# Patient Record
Sex: Female | Born: 1950 | Race: White | Hispanic: No | Smoking: Former smoker
Health system: Southern US, Community
[De-identification: ages and names within clinical notes are randomized; demographics above are authoritative.]

## PROBLEM LIST (undated history)

## (undated) DIAGNOSIS — E119 Type 2 diabetes mellitus without complications: Secondary | ICD-10-CM

## (undated) DIAGNOSIS — F329 Major depressive disorder, single episode, unspecified: Secondary | ICD-10-CM

## (undated) DIAGNOSIS — G4730 Sleep apnea, unspecified: Secondary | ICD-10-CM

## (undated) DIAGNOSIS — J449 Chronic obstructive pulmonary disease, unspecified: Secondary | ICD-10-CM

## (undated) DIAGNOSIS — N189 Chronic kidney disease, unspecified: Secondary | ICD-10-CM

## (undated) DIAGNOSIS — T148XXA Other injury of unspecified body region, initial encounter: Secondary | ICD-10-CM

## (undated) DIAGNOSIS — I10 Essential (primary) hypertension: Secondary | ICD-10-CM

## (undated) DIAGNOSIS — I4892 Unspecified atrial flutter: Secondary | ICD-10-CM

## (undated) DIAGNOSIS — C189 Malignant neoplasm of colon, unspecified: Secondary | ICD-10-CM

## (undated) DIAGNOSIS — M1990 Unspecified osteoarthritis, unspecified site: Secondary | ICD-10-CM

## (undated) DIAGNOSIS — R062 Wheezing: Secondary | ICD-10-CM

## (undated) DIAGNOSIS — E669 Obesity, unspecified: Secondary | ICD-10-CM

## (undated) DIAGNOSIS — F419 Anxiety disorder, unspecified: Secondary | ICD-10-CM

## (undated) DIAGNOSIS — K219 Gastro-esophageal reflux disease without esophagitis: Secondary | ICD-10-CM

## (undated) DIAGNOSIS — E785 Hyperlipidemia, unspecified: Secondary | ICD-10-CM

## (undated) DIAGNOSIS — F32A Depression, unspecified: Secondary | ICD-10-CM

## (undated) HISTORY — PX: COLON SURGERY: SHX602

## (undated) HISTORY — DX: Obesity, unspecified: E66.9

## (undated) HISTORY — PX: CHOLECYSTECTOMY: SHX55

## (undated) HISTORY — PX: APPENDECTOMY: SHX54

## (undated) HISTORY — DX: Malignant neoplasm of colon, unspecified: C18.9

## (undated) HISTORY — DX: Essential (primary) hypertension: I10

## (undated) HISTORY — DX: Gastro-esophageal reflux disease without esophagitis: K21.9

## (undated) HISTORY — DX: Type 2 diabetes mellitus without complications: E11.9

## (undated) HISTORY — DX: Unspecified atrial flutter: I48.92

---

## 2012-05-05 ENCOUNTER — Ambulatory Visit: Payer: Self-pay

## 2013-09-17 ENCOUNTER — Ambulatory Visit: Payer: Self-pay | Admitting: Family Medicine

## 2014-04-30 ENCOUNTER — Observation Stay: Payer: Self-pay | Admitting: Surgery

## 2014-04-30 LAB — CBC WITH DIFFERENTIAL/PLATELET
Basophil #: 0.1 10*3/uL (ref 0.0–0.1)
Basophil %: 0.7 %
Eosinophil #: 0.2 10*3/uL (ref 0.0–0.7)
Eosinophil %: 1.1 %
HCT: 37.3 % (ref 35.0–47.0)
HGB: 11.9 g/dL — ABNORMAL LOW (ref 12.0–16.0)
Lymphocyte #: 2.2 10*3/uL (ref 1.0–3.6)
Lymphocyte %: 10.2 %
MCH: 27.1 pg (ref 26.0–34.0)
MCHC: 32 g/dL (ref 32.0–36.0)
MCV: 85 fL (ref 80–100)
MONOS PCT: 4.7 %
Monocyte #: 1 x10 3/mm — ABNORMAL HIGH (ref 0.2–0.9)
Neutrophil #: 17.7 10*3/uL — ABNORMAL HIGH (ref 1.4–6.5)
Neutrophil %: 83.3 %
Platelet: 311 10*3/uL (ref 150–440)
RBC: 4.4 10*6/uL (ref 3.80–5.20)
RDW: 16.1 % — ABNORMAL HIGH (ref 11.5–14.5)
WBC: 21.3 10*3/uL — ABNORMAL HIGH (ref 3.6–11.0)

## 2014-04-30 LAB — URINALYSIS, COMPLETE
BLOOD: NEGATIVE
Bacteria: NONE SEEN
Bilirubin,UR: NEGATIVE
Glucose,UR: NEGATIVE mg/dL (ref 0–75)
LEUKOCYTE ESTERASE: NEGATIVE
Nitrite: NEGATIVE
Ph: 5 (ref 4.5–8.0)
Protein: NEGATIVE
RBC,UR: 1 /HPF (ref 0–5)
Specific Gravity: 1.02 (ref 1.003–1.030)
Squamous Epithelial: 2

## 2014-04-30 LAB — LIPASE, BLOOD: Lipase: 109 U/L (ref 73–393)

## 2014-04-30 LAB — COMPREHENSIVE METABOLIC PANEL
Albumin: 3.5 g/dL (ref 3.4–5.0)
Alkaline Phosphatase: 117 U/L — ABNORMAL HIGH
Anion Gap: 10 (ref 7–16)
BILIRUBIN TOTAL: 0.4 mg/dL (ref 0.2–1.0)
BUN: 15 mg/dL (ref 7–18)
CO2: 26 mmol/L (ref 21–32)
Calcium, Total: 9 mg/dL (ref 8.5–10.1)
Chloride: 97 mmol/L — ABNORMAL LOW (ref 98–107)
Creatinine: 0.81 mg/dL (ref 0.60–1.30)
EGFR (Non-African Amer.): >60
Glucose: 180 mg/dL — ABNORMAL HIGH (ref 65–99)
Osmolality: 272 (ref 275–301)
Potassium: 3.7 mmol/L (ref 3.5–5.1)
SGOT(AST): 18 U/L (ref 15–37)
SGPT (ALT): 32 U/L
SODIUM: 133 mmol/L — AB (ref 136–145)
Total Protein: 8.1 g/dL (ref 6.4–8.2)

## 2014-04-30 LAB — TROPONIN I

## 2014-05-02 LAB — CBC WITH DIFFERENTIAL/PLATELET
Basophil #: 0.1 10*3/uL (ref 0.0–0.1)
Basophil %: 0.5 %
EOS PCT: 0.4 %
Eosinophil #: 0.1 10*3/uL (ref 0.0–0.7)
HCT: 29.7 % — ABNORMAL LOW (ref 35.0–47.0)
HGB: 9.4 g/dL — ABNORMAL LOW (ref 12.0–16.0)
LYMPHS ABS: 2.5 10*3/uL (ref 1.0–3.6)
LYMPHS PCT: 13.6 %
MCH: 27.4 pg (ref 26.0–34.0)
MCHC: 31.8 g/dL — ABNORMAL LOW (ref 32.0–36.0)
MCV: 86 fL (ref 80–100)
MONO ABS: 1.5 x10 3/mm — AB (ref 0.2–0.9)
Monocyte %: 8.1 %
NEUTROS PCT: 77.4 %
Neutrophil #: 14.1 10*3/uL — ABNORMAL HIGH (ref 1.4–6.5)
PLATELETS: 261 10*3/uL (ref 150–440)
RBC: 3.45 10*6/uL — ABNORMAL LOW (ref 3.80–5.20)
RDW: 16.4 % — AB (ref 11.5–14.5)
WBC: 18.3 10*3/uL — ABNORMAL HIGH (ref 3.6–11.0)

## 2014-05-02 LAB — BASIC METABOLIC PANEL
Anion Gap: 5 — ABNORMAL LOW (ref 7–16)
BUN: 13 mg/dL (ref 7–18)
Calcium, Total: 7.8 mg/dL — ABNORMAL LOW (ref 8.5–10.1)
Chloride: 100 mmol/L (ref 98–107)
Co2: 27 mmol/L (ref 21–32)
Creatinine: 0.96 mg/dL (ref 0.60–1.30)
EGFR (African American): >60
EGFR (Non-African Amer.): >60
GLUCOSE: 150 mg/dL — AB (ref 65–99)
OSMOLALITY: 267 (ref 275–301)
Potassium: 3.6 mmol/L (ref 3.5–5.1)
Sodium: 132 mmol/L — ABNORMAL LOW (ref 136–145)

## 2014-07-09 ENCOUNTER — Ambulatory Visit
Admit: 2014-07-09 | Disposition: A | Discharge status: Home / Self Care | Payer: Self-pay | Attending: Internal Medicine | Admitting: Internal Medicine

## 2014-07-10 ENCOUNTER — Inpatient Hospital Stay: Payer: Medicare Other | Admitting: Surgery

## 2014-07-12 DIAGNOSIS — I4892 Unspecified atrial flutter: Secondary | ICD-10-CM

## 2014-07-12 DIAGNOSIS — I4891 Unspecified atrial fibrillation: Secondary | ICD-10-CM | POA: Diagnosis not present

## 2014-07-13 DIAGNOSIS — I4892 Unspecified atrial flutter: Secondary | ICD-10-CM | POA: Diagnosis not present

## 2014-07-14 DIAGNOSIS — I4892 Unspecified atrial flutter: Secondary | ICD-10-CM | POA: Diagnosis not present

## 2014-07-15 ENCOUNTER — Encounter: Payer: Self-pay | Admitting: Physician Assistant

## 2014-07-15 ENCOUNTER — Other Ambulatory Visit: Payer: Self-pay | Admitting: Physician Assistant

## 2014-07-15 DIAGNOSIS — E119 Type 2 diabetes mellitus without complications: Secondary | ICD-10-CM | POA: Insufficient documentation

## 2014-07-15 DIAGNOSIS — I4891 Unspecified atrial fibrillation: Secondary | ICD-10-CM | POA: Diagnosis not present

## 2014-07-15 DIAGNOSIS — K219 Gastro-esophageal reflux disease without esophagitis: Secondary | ICD-10-CM | POA: Insufficient documentation

## 2014-07-15 DIAGNOSIS — I10 Essential (primary) hypertension: Secondary | ICD-10-CM | POA: Insufficient documentation

## 2014-07-15 DIAGNOSIS — E669 Obesity, unspecified: Secondary | ICD-10-CM | POA: Insufficient documentation

## 2014-07-15 DIAGNOSIS — I4892 Unspecified atrial flutter: Secondary | ICD-10-CM | POA: Insufficient documentation

## 2014-07-17 DIAGNOSIS — I4892 Unspecified atrial flutter: Secondary | ICD-10-CM | POA: Diagnosis not present

## 2014-07-20 DIAGNOSIS — I4891 Unspecified atrial fibrillation: Secondary | ICD-10-CM | POA: Diagnosis not present

## 2014-07-22 DIAGNOSIS — I4891 Unspecified atrial fibrillation: Secondary | ICD-10-CM | POA: Diagnosis not present

## 2014-07-23 DIAGNOSIS — I480 Paroxysmal atrial fibrillation: Secondary | ICD-10-CM | POA: Diagnosis not present

## 2014-07-24 DIAGNOSIS — I4891 Unspecified atrial fibrillation: Secondary | ICD-10-CM | POA: Diagnosis not present

## 2014-07-25 DIAGNOSIS — I480 Paroxysmal atrial fibrillation: Secondary | ICD-10-CM | POA: Diagnosis not present

## 2014-07-26 DIAGNOSIS — I480 Paroxysmal atrial fibrillation: Secondary | ICD-10-CM

## 2014-07-27 ENCOUNTER — Ambulatory Visit: Admit: 2014-07-27 | Disposition: A | Discharge status: Home / Self Care | Payer: Self-pay | Admitting: Surgery

## 2014-07-27 DIAGNOSIS — I480 Paroxysmal atrial fibrillation: Secondary | ICD-10-CM | POA: Diagnosis not present

## 2014-07-28 DIAGNOSIS — I480 Paroxysmal atrial fibrillation: Secondary | ICD-10-CM | POA: Diagnosis not present

## 2014-07-29 DIAGNOSIS — I480 Paroxysmal atrial fibrillation: Secondary | ICD-10-CM | POA: Diagnosis not present

## 2014-07-30 DIAGNOSIS — I480 Paroxysmal atrial fibrillation: Secondary | ICD-10-CM | POA: Diagnosis not present

## 2014-07-31 DIAGNOSIS — I480 Paroxysmal atrial fibrillation: Secondary | ICD-10-CM | POA: Diagnosis not present

## 2014-08-01 DIAGNOSIS — I480 Paroxysmal atrial fibrillation: Secondary | ICD-10-CM | POA: Diagnosis not present

## 2014-08-01 LAB — CBC WITH DIFFERENTIAL/PLATELET
BANDS NEUTROPHIL: 1 %
EOS PCT: 1 %
HCT: 24.7 % — ABNORMAL LOW (ref 35.0–47.0)
HGB: 7.7 g/dL — AB (ref 12.0–16.0)
LYMPHS PCT: 8 %
MCH: 24.7 pg — ABNORMAL LOW (ref 26.0–34.0)
MCHC: 31.2 g/dL — AB (ref 32.0–36.0)
MCV: 79 fL — ABNORMAL LOW (ref 80–100)
Metamyelocyte: 3 %
Monocytes: 4 %
Myelocyte: 1 %
Platelet: 198 10*3/uL (ref 150–440)
RBC: 3.11 10*6/uL — ABNORMAL LOW (ref 3.80–5.20)
RDW: 22.7 % — ABNORMAL HIGH (ref 11.5–14.5)
SEGMENTED NEUTROPHILS: 82 %
WBC: 18.8 10*3/uL — AB (ref 3.6–11.0)

## 2014-08-01 LAB — BASIC METABOLIC PANEL
Anion Gap: 12 (ref 7–16)
BUN: 34 mg/dL — AB
CO2: 22 mmol/L
CREATININE: 4.57 mg/dL — AB
Calcium, Total: 8 mg/dL — ABNORMAL LOW
Chloride: 104 mmol/L
EGFR (African American): 11 — ABNORMAL LOW
GFR CALC NON AF AMER: 10 — AB
GLUCOSE: 91 mg/dL
Potassium: 4.5 mmol/L
Sodium: 138 mmol/L

## 2014-08-01 LAB — PROTIME-INR
INR: 1.3
Prothrombin Time: 16.4 secs — ABNORMAL HIGH

## 2014-08-01 LAB — APTT: ACTIVATED PTT: 42.4 s — AB (ref 23.6–35.9)

## 2014-08-02 DIAGNOSIS — I480 Paroxysmal atrial fibrillation: Secondary | ICD-10-CM | POA: Diagnosis not present

## 2014-08-02 LAB — BASIC METABOLIC PANEL
ANION GAP: 12 (ref 7–16)
BUN: 47 mg/dL — ABNORMAL HIGH
CO2: 20 mmol/L — AB
Calcium, Total: 8.3 mg/dL — ABNORMAL LOW
Chloride: 105 mmol/L
Creatinine: 5.83 mg/dL — ABNORMAL HIGH
GFR CALC AF AMER: 8 — AB
GFR CALC NON AF AMER: 7 — AB
Glucose: 83 mg/dL
Potassium: 4 mmol/L
Sodium: 137 mmol/L

## 2014-08-02 LAB — CBC WITH DIFFERENTIAL/PLATELET
Basophil #: 0.2 10*3/uL — ABNORMAL HIGH (ref 0.0–0.1)
Basophil %: 1 %
Eosinophil #: 0.3 10*3/uL (ref 0.0–0.7)
Eosinophil %: 1.7 %
HCT: 27.7 % — ABNORMAL LOW (ref 35.0–47.0)
HGB: 8.5 g/dL — ABNORMAL LOW (ref 12.0–16.0)
Lymphocyte #: 2.1 10*3/uL (ref 1.0–3.6)
Lymphocyte %: 12.2 %
MCH: 24.8 pg — AB (ref 26.0–34.0)
MCHC: 30.9 g/dL — ABNORMAL LOW (ref 32.0–36.0)
MCV: 80 fL (ref 80–100)
MONO ABS: 1.6 x10 3/mm — AB (ref 0.2–0.9)
MONOS PCT: 9.4 %
Neutrophil #: 12.8 10*3/uL — ABNORMAL HIGH (ref 1.4–6.5)
Neutrophil %: 75.7 %
PLATELETS: 208 10*3/uL (ref 150–440)
RBC: 3.44 10*6/uL — AB (ref 3.80–5.20)
RDW: 23.2 % — ABNORMAL HIGH (ref 11.5–14.5)
WBC: 16.9 10*3/uL — ABNORMAL HIGH (ref 3.6–11.0)

## 2014-08-02 LAB — PHOSPHORUS: PHOSPHORUS: 6.4 mg/dL — AB

## 2014-08-02 LAB — HEPARIN LEVEL (UNFRACTIONATED)
Anti-Xa(Unfractionated): 0.36 IU/mL (ref 0.30–0.70)
Anti-Xa(Unfractionated): <0.1 IU/mL — ABNORMAL LOW (ref 0.30–0.70)

## 2014-08-02 LAB — PROTIME-INR
INR: 1.3
PROTHROMBIN TIME: 15.9 s — AB

## 2014-08-02 LAB — VANCOMYCIN, TROUGH: VANCOMYCIN, TROUGH: 20 ug/mL

## 2014-08-03 LAB — CBC WITH DIFFERENTIAL/PLATELET
BASOS PCT: 0.7 %
Basophil #: 0.1 10*3/uL (ref 0.0–0.1)
Eosinophil #: 0.2 10*3/uL (ref 0.0–0.7)
Eosinophil %: 1.3 %
HCT: 27.4 % — ABNORMAL LOW (ref 35.0–47.0)
HGB: 8.3 g/dL — ABNORMAL LOW (ref 12.0–16.0)
LYMPHS ABS: 1.8 10*3/uL (ref 1.0–3.6)
Lymphocyte %: 11.1 %
MCH: 24.6 pg — AB (ref 26.0–34.0)
MCHC: 30.4 g/dL — AB (ref 32.0–36.0)
MCV: 81 fL (ref 80–100)
MONO ABS: 1.5 x10 3/mm — AB (ref 0.2–0.9)
Monocyte %: 9.5 %
Neutrophil #: 12.3 10*3/uL — ABNORMAL HIGH (ref 1.4–6.5)
Neutrophil %: 77.4 %
PLATELETS: 178 10*3/uL (ref 150–440)
RBC: 3.39 10*6/uL — ABNORMAL LOW (ref 3.80–5.20)
RDW: 24.2 % — ABNORMAL HIGH (ref 11.5–14.5)
WBC: 15.9 10*3/uL — ABNORMAL HIGH (ref 3.6–11.0)

## 2014-08-03 LAB — HEPARIN LEVEL (UNFRACTIONATED)
ANTI-XA(UNFRACTIONATED): 0.16 [IU]/mL — AB (ref 0.30–0.70)
Anti-Xa(Unfractionated): 0.25 IU/mL — ABNORMAL LOW (ref 0.30–0.70)

## 2014-08-04 LAB — CBC WITH DIFFERENTIAL/PLATELET
Basophil #: 0.1 10*3/uL (ref 0.0–0.1)
Basophil %: 1 %
Eosinophil #: 0.3 10*3/uL (ref 0.0–0.7)
Eosinophil %: 2.2 %
HCT: 27.3 % — ABNORMAL LOW (ref 35.0–47.0)
HGB: 8.4 g/dL — ABNORMAL LOW (ref 12.0–16.0)
LYMPHS ABS: 1.7 10*3/uL (ref 1.0–3.6)
Lymphocyte %: 11.8 %
MCH: 24.7 pg — ABNORMAL LOW (ref 26.0–34.0)
MCHC: 30.8 g/dL — ABNORMAL LOW (ref 32.0–36.0)
MCV: 80 fL (ref 80–100)
MONOS PCT: 10.3 %
Monocyte #: 1.5 x10 3/mm — ABNORMAL HIGH (ref 0.2–0.9)
Neutrophil #: 11.1 10*3/uL — ABNORMAL HIGH (ref 1.4–6.5)
Neutrophil %: 74.7 %
Platelet: 191 10*3/uL (ref 150–440)
RBC: 3.4 10*6/uL — AB (ref 3.80–5.20)
RDW: 24.6 % — AB (ref 11.5–14.5)
WBC: 14.9 10*3/uL — ABNORMAL HIGH (ref 3.6–11.0)

## 2014-08-04 LAB — HEPARIN LEVEL (UNFRACTIONATED)
ANTI-XA(UNFRACTIONATED): 0.44 [IU]/mL (ref 0.30–0.70)
ANTI-XA(UNFRACTIONATED): 0.49 [IU]/mL (ref 0.30–0.70)

## 2014-08-05 LAB — CBC WITH DIFFERENTIAL/PLATELET
Basophil #: 0.1 10*3/uL (ref 0.0–0.1)
Basophil %: 1.1 %
Eosinophil #: 0.4 10*3/uL (ref 0.0–0.7)
Eosinophil %: 3.1 %
HCT: 28.5 % — AB (ref 35.0–47.0)
HGB: 8.6 g/dL — ABNORMAL LOW (ref 12.0–16.0)
LYMPHS ABS: 1.7 10*3/uL (ref 1.0–3.6)
Lymphocyte %: 12.8 %
MCH: 24.7 pg — AB (ref 26.0–34.0)
MCHC: 30.2 g/dL — AB (ref 32.0–36.0)
MCV: 82 fL (ref 80–100)
MONOS PCT: 10.9 %
Monocyte #: 1.5 x10 3/mm — ABNORMAL HIGH (ref 0.2–0.9)
NEUTROS ABS: 9.8 10*3/uL — AB (ref 1.4–6.5)
Neutrophil %: 72.1 %
Platelet: 206 10*3/uL (ref 150–440)
RBC: 3.48 10*6/uL — ABNORMAL LOW (ref 3.80–5.20)
RDW: 25.5 % — AB (ref 11.5–14.5)
WBC: 13.5 10*3/uL — ABNORMAL HIGH (ref 3.6–11.0)

## 2014-08-05 LAB — RENAL FUNCTION PANEL
ALBUMIN: 2.2 g/dL — AB
ANION GAP: 13 (ref 7–16)
BUN: 46 mg/dL — ABNORMAL HIGH
CALCIUM: 8.4 mg/dL — AB
CREATININE: 6.42 mg/dL — AB
Chloride: 106 mmol/L
Co2: 20 mmol/L — ABNORMAL LOW
EGFR (Non-African Amer.): 6 — ABNORMAL LOW
GFR CALC AF AMER: 7 — AB
Glucose: 81 mg/dL
POTASSIUM: 4.4 mmol/L
Phosphorus: 6.9 mg/dL — ABNORMAL HIGH
SODIUM: 139 mmol/L

## 2014-08-05 LAB — PROTIME-INR
INR: 1.5
Prothrombin Time: 18.3 secs — ABNORMAL HIGH

## 2014-08-05 LAB — HEPARIN LEVEL (UNFRACTIONATED): ANTI-XA(UNFRACTIONATED): 0.5 [IU]/mL (ref 0.30–0.70)

## 2014-08-06 ENCOUNTER — Other Ambulatory Visit: Payer: Self-pay | Admitting: Physician Assistant

## 2014-08-06 ENCOUNTER — Inpatient Hospital Stay
Admission: AD | Admit: 2014-08-06 | Discharge: 2014-10-04 | Disposition: A | Discharge status: Skilled Nursing Facility | Payer: Self-pay | Source: Ambulatory Visit | Attending: Internal Medicine | Admitting: Internal Medicine

## 2014-08-06 DIAGNOSIS — Z9889 Other specified postprocedural states: Secondary | ICD-10-CM

## 2014-08-06 DIAGNOSIS — R0603 Acute respiratory distress: Secondary | ICD-10-CM

## 2014-08-06 DIAGNOSIS — I4892 Unspecified atrial flutter: Secondary | ICD-10-CM

## 2014-08-06 DIAGNOSIS — IMO0002 Reserved for concepts with insufficient information to code with codable children: Secondary | ICD-10-CM | POA: Insufficient documentation

## 2014-08-06 DIAGNOSIS — L0291 Cutaneous abscess, unspecified: Secondary | ICD-10-CM

## 2014-08-06 DIAGNOSIS — R0689 Other abnormalities of breathing: Secondary | ICD-10-CM

## 2014-08-06 DIAGNOSIS — R1084 Generalized abdominal pain: Secondary | ICD-10-CM

## 2014-08-06 DIAGNOSIS — R9409 Abnormal results of other function studies of central nervous system: Secondary | ICD-10-CM

## 2014-08-06 DIAGNOSIS — R0602 Shortness of breath: Secondary | ICD-10-CM

## 2014-08-06 DIAGNOSIS — Z452 Encounter for adjustment and management of vascular access device: Secondary | ICD-10-CM

## 2014-08-06 DIAGNOSIS — J9690 Respiratory failure, unspecified, unspecified whether with hypoxia or hypercapnia: Secondary | ICD-10-CM

## 2014-08-06 DIAGNOSIS — J90 Pleural effusion, not elsewhere classified: Secondary | ICD-10-CM

## 2014-08-06 LAB — CBC WITH DIFFERENTIAL/PLATELET
Basophil #: 0.1 10*3/uL (ref 0.0–0.1)
Basophil %: 0.8 %
EOS PCT: 2.7 %
Eosinophil #: 0.4 10*3/uL (ref 0.0–0.7)
HCT: 28.3 % — ABNORMAL LOW (ref 35.0–47.0)
HGB: 8.9 g/dL — ABNORMAL LOW (ref 12.0–16.0)
LYMPHS PCT: 9.4 %
Lymphocyte #: 1.4 10*3/uL (ref 1.0–3.6)
MCH: 25.2 pg — ABNORMAL LOW (ref 26.0–34.0)
MCHC: 31.4 g/dL — AB (ref 32.0–36.0)
MCV: 80 fL (ref 80–100)
Monocyte #: 1.5 x10 3/mm — ABNORMAL HIGH (ref 0.2–0.9)
Monocyte %: 10.6 %
NEUTROS ABS: 11.1 10*3/uL — AB (ref 1.4–6.5)
NEUTROS PCT: 76.5 %
Platelet: 195 10*3/uL (ref 150–440)
RBC: 3.53 10*6/uL — AB (ref 3.80–5.20)
RDW: 25.8 % — ABNORMAL HIGH (ref 11.5–14.5)
WBC: 14.5 10*3/uL — AB (ref 3.6–11.0)

## 2014-08-06 LAB — PROTIME-INR
INR: 1.8
Prothrombin Time: 20.7 secs — ABNORMAL HIGH

## 2014-08-06 LAB — HEPARIN LEVEL (UNFRACTIONATED): Anti-Xa(Unfractionated): <0.1 IU/mL — ABNORMAL LOW (ref 0.30–0.70)

## 2014-08-07 ENCOUNTER — Ambulatory Visit (HOSPITAL_COMMUNITY)
Admission: AD | Admit: 2014-08-07 | Discharge: 2014-08-07 | Disposition: A | Discharge status: Home / Self Care | Payer: Medicare Other | Source: Other Acute Inpatient Hospital | Attending: Internal Medicine | Admitting: Internal Medicine

## 2014-08-07 DIAGNOSIS — K5720 Diverticulitis of large intestine with perforation and abscess without bleeding: Secondary | ICD-10-CM | POA: Diagnosis present

## 2014-08-07 DIAGNOSIS — A419 Sepsis, unspecified organism: Secondary | ICD-10-CM | POA: Diagnosis not present

## 2014-08-07 LAB — CBC
HCT: 28.7 % — ABNORMAL LOW (ref 36.0–46.0)
Hemoglobin: 8.7 g/dL — ABNORMAL LOW (ref 12.0–15.0)
MCH: 25.3 pg — ABNORMAL LOW (ref 26.0–34.0)
MCHC: 30.3 g/dL (ref 30.0–36.0)
MCV: 83.4 fL (ref 78.0–100.0)
Platelets: 215 10*3/uL (ref 150–400)
RBC: 3.44 MIL/uL — ABNORMAL LOW (ref 3.87–5.11)
RDW: 25.2 % — ABNORMAL HIGH (ref 11.5–15.5)
WBC: 12.4 10*3/uL — ABNORMAL HIGH (ref 4.0–10.5)

## 2014-08-07 LAB — COMPREHENSIVE METABOLIC PANEL
ALK PHOS: 142 U/L — AB (ref 39–117)
ALT: 19 U/L (ref 0–35)
AST: 19 U/L (ref 0–37)
Albumin: 1.9 g/dL — ABNORMAL LOW (ref 3.5–5.2)
Anion gap: 12 (ref 5–15)
BILIRUBIN TOTAL: 0.5 mg/dL (ref 0.3–1.2)
BUN: 27 mg/dL — AB (ref 6–23)
CHLORIDE: 104 mmol/L (ref 96–112)
CO2: 24 mmol/L (ref 19–32)
Calcium: 8.7 mg/dL (ref 8.4–10.5)
Creatinine, Ser: 5.84 mg/dL — ABNORMAL HIGH (ref 0.50–1.10)
GFR calc Af Amer: 8 mL/min — ABNORMAL LOW (ref >90)
GFR calc non Af Amer: 7 mL/min — ABNORMAL LOW (ref >90)
Glucose, Bld: 86 mg/dL (ref 70–99)
POTASSIUM: 4.2 mmol/L (ref 3.5–5.1)
Sodium: 140 mmol/L (ref 135–145)
Total Protein: 5.6 g/dL — ABNORMAL LOW (ref 6.0–8.3)

## 2014-08-07 LAB — MAGNESIUM: Magnesium: 1.9 mg/dL (ref 1.5–2.5)

## 2014-08-07 LAB — PHOSPHORUS: Phosphorus: 6.1 mg/dL — ABNORMAL HIGH (ref 2.3–4.6)

## 2014-08-07 LAB — PROTIME-INR
INR: 2.46 — AB (ref 0.00–1.49)
Prothrombin Time: 26.9 seconds — ABNORMAL HIGH (ref 11.6–15.2)

## 2014-08-07 LAB — VANCOMYCIN, RANDOM: Vancomycin Rm: 18.9 ug/mL

## 2014-08-08 LAB — PROTIME-INR
INR: 2.14 — AB (ref 0.00–1.49)
Prothrombin Time: 24.1 seconds — ABNORMAL HIGH (ref 11.6–15.2)

## 2014-08-09 ENCOUNTER — Ambulatory Visit
Admit: 2014-08-09 | Disposition: A | Discharge status: Home / Self Care | Payer: Self-pay | Attending: Internal Medicine | Admitting: Internal Medicine

## 2014-08-09 LAB — PROTIME-INR
INR: 2.52 — ABNORMAL HIGH (ref 0.00–1.49)
Prothrombin Time: 27.4 seconds — ABNORMAL HIGH (ref 11.6–15.2)

## 2014-08-10 LAB — COMPREHENSIVE METABOLIC PANEL
ALBUMIN: 1.7 g/dL — AB (ref 3.5–5.2)
ALT: 18 U/L (ref 0–35)
ANION GAP: 14 (ref 5–15)
AST: 17 U/L (ref 0–37)
Alkaline Phosphatase: 139 U/L — ABNORMAL HIGH (ref 39–117)
BILIRUBIN TOTAL: 0.4 mg/dL (ref 0.3–1.2)
BUN: 48 mg/dL — ABNORMAL HIGH (ref 6–23)
CO2: 18 mmol/L — ABNORMAL LOW (ref 19–32)
CREATININE: 8.1 mg/dL — AB (ref 0.50–1.10)
Calcium: 8.2 mg/dL — ABNORMAL LOW (ref 8.4–10.5)
Chloride: 106 mmol/L (ref 96–112)
GFR calc Af Amer: 6 mL/min — ABNORMAL LOW (ref >90)
GFR calc non Af Amer: 5 mL/min — ABNORMAL LOW (ref >90)
Glucose, Bld: 90 mg/dL (ref 70–99)
POTASSIUM: 4.3 mmol/L (ref 3.5–5.1)
SODIUM: 138 mmol/L (ref 135–145)
TOTAL PROTEIN: 5.5 g/dL — AB (ref 6.0–8.3)

## 2014-08-10 LAB — CBC WITH DIFFERENTIAL/PLATELET
BASOS PCT: 0 % (ref 0–1)
Basophils Absolute: 0 10*3/uL (ref 0.0–0.1)
Eosinophils Absolute: 0.7 10*3/uL (ref 0.0–0.7)
Eosinophils Relative: 5 % (ref 0–5)
HEMATOCRIT: 28.3 % — AB (ref 36.0–46.0)
HEMOGLOBIN: 8.7 g/dL — AB (ref 12.0–15.0)
LYMPHS PCT: 9 % — AB (ref 12–46)
Lymphs Abs: 1.2 10*3/uL (ref 0.7–4.0)
MCH: 25.7 pg — ABNORMAL LOW (ref 26.0–34.0)
MCHC: 30.7 g/dL (ref 30.0–36.0)
MCV: 83.5 fL (ref 78.0–100.0)
Monocytes Absolute: 1.4 10*3/uL — ABNORMAL HIGH (ref 0.1–1.0)
Monocytes Relative: 10 % (ref 3–12)
NEUTROS PCT: 76 % (ref 43–77)
Neutro Abs: 10.5 10*3/uL — ABNORMAL HIGH (ref 1.7–7.7)
Platelets: 236 10*3/uL (ref 150–400)
RBC: 3.39 MIL/uL — ABNORMAL LOW (ref 3.87–5.11)
RDW: 25.1 % — AB (ref 11.5–15.5)
WBC: 13.8 10*3/uL — ABNORMAL HIGH (ref 4.0–10.5)

## 2014-08-10 LAB — PROTIME-INR
INR: 2.59 — AB (ref 0.00–1.49)
Prothrombin Time: 28 seconds — ABNORMAL HIGH (ref 11.6–15.2)

## 2014-08-11 LAB — BASIC METABOLIC PANEL
Anion gap: 13 (ref 5–15)
BUN: 54 mg/dL — AB (ref 6–23)
CALCIUM: 8.3 mg/dL — AB (ref 8.4–10.5)
CHLORIDE: 101 mmol/L (ref 96–112)
CO2: 22 mmol/L (ref 19–32)
CREATININE: 8.73 mg/dL — AB (ref 0.50–1.10)
GFR calc Af Amer: 5 mL/min — ABNORMAL LOW (ref >90)
GFR calc non Af Amer: 4 mL/min — ABNORMAL LOW (ref >90)
Glucose, Bld: 98 mg/dL (ref 70–99)
Potassium: 4.2 mmol/L (ref 3.5–5.1)
Sodium: 136 mmol/L (ref 135–145)

## 2014-08-11 LAB — URINALYSIS, ROUTINE W REFLEX MICROSCOPIC
BILIRUBIN URINE: NEGATIVE
GLUCOSE, UA: NEGATIVE mg/dL
Ketones, ur: NEGATIVE mg/dL
Nitrite: NEGATIVE
PROTEIN: 100 mg/dL — AB
SPECIFIC GRAVITY, URINE: 1.011 (ref 1.005–1.030)
Urobilinogen, UA: 0.2 mg/dL (ref 0.0–1.0)
pH: 5.5 (ref 5.0–8.0)

## 2014-08-11 LAB — CBC
HEMATOCRIT: 31.7 % — AB (ref 36.0–46.0)
Hemoglobin: 9.5 g/dL — ABNORMAL LOW (ref 12.0–15.0)
MCH: 25.1 pg — ABNORMAL LOW (ref 26.0–34.0)
MCHC: 30 g/dL (ref 30.0–36.0)
MCV: 83.6 fL (ref 78.0–100.0)
Platelets: 250 10*3/uL (ref 150–400)
RBC: 3.79 MIL/uL — AB (ref 3.87–5.11)
RDW: 24.8 % — AB (ref 11.5–15.5)
WBC: 14.7 10*3/uL — ABNORMAL HIGH (ref 4.0–10.5)

## 2014-08-11 LAB — PROTIME-INR
INR: 2.61 — ABNORMAL HIGH (ref 0.00–1.49)
PROTHROMBIN TIME: 28.2 s — AB (ref 11.6–15.2)

## 2014-08-11 LAB — URINE MICROSCOPIC-ADD ON

## 2014-08-12 ENCOUNTER — Other Ambulatory Visit (HOSPITAL_COMMUNITY): Payer: Self-pay

## 2014-08-12 LAB — BASIC METABOLIC PANEL
ANION GAP: 14 (ref 5–15)
BUN: 65 mg/dL — ABNORMAL HIGH (ref 6–23)
CALCIUM: 8.2 mg/dL — AB (ref 8.4–10.5)
CO2: 20 mmol/L (ref 19–32)
Chloride: 105 mmol/L (ref 96–112)
Creatinine, Ser: 9.43 mg/dL — ABNORMAL HIGH (ref 0.50–1.10)
GFR calc non Af Amer: 4 mL/min — ABNORMAL LOW (ref >90)
GFR, EST AFRICAN AMERICAN: 5 mL/min — AB (ref >90)
Glucose, Bld: 85 mg/dL (ref 70–99)
Potassium: 4.7 mmol/L (ref 3.5–5.1)
SODIUM: 139 mmol/L (ref 135–145)

## 2014-08-12 LAB — CBC
HCT: 29.5 % — ABNORMAL LOW (ref 36.0–46.0)
HEMOGLOBIN: 8.8 g/dL — AB (ref 12.0–15.0)
MCH: 24.7 pg — AB (ref 26.0–34.0)
MCHC: 29.8 g/dL — AB (ref 30.0–36.0)
MCV: 82.9 fL (ref 78.0–100.0)
Platelets: 247 10*3/uL (ref 150–400)
RBC: 3.56 MIL/uL — ABNORMAL LOW (ref 3.87–5.11)
RDW: 24.6 % — ABNORMAL HIGH (ref 11.5–15.5)
WBC: 12.5 10*3/uL — ABNORMAL HIGH (ref 4.0–10.5)

## 2014-08-12 LAB — URINE CULTURE

## 2014-08-12 LAB — PROTIME-INR
INR: 3.06 — AB (ref 0.00–1.49)
Prothrombin Time: 31.8 seconds — ABNORMAL HIGH (ref 11.6–15.2)

## 2014-08-13 LAB — HEPATITIS B SURFACE ANTIGEN: Hepatitis B Surface Ag: NEGATIVE

## 2014-08-13 LAB — HEPATITIS B SURFACE ANTIBODY,QUALITATIVE: HEP B S AB: REACTIVE

## 2014-08-13 LAB — PROTIME-INR
INR: 2.99 — ABNORMAL HIGH (ref 0.00–1.49)
Prothrombin Time: 31.3 seconds — ABNORMAL HIGH (ref 11.6–15.2)

## 2014-08-13 LAB — HEPATITIS B CORE ANTIBODY, TOTAL: HEP B C TOTAL AB: POSITIVE — AB

## 2014-08-14 ENCOUNTER — Other Ambulatory Visit (HOSPITAL_COMMUNITY): Payer: Self-pay

## 2014-08-14 LAB — CBC
HEMATOCRIT: 30.9 % — AB (ref 36.0–46.0)
Hemoglobin: 9.3 g/dL — ABNORMAL LOW (ref 12.0–15.0)
MCH: 25.5 pg — ABNORMAL LOW (ref 26.0–34.0)
MCHC: 30.1 g/dL (ref 30.0–36.0)
MCV: 84.7 fL (ref 78.0–100.0)
Platelets: 260 10*3/uL (ref 150–400)
RBC: 3.65 MIL/uL — ABNORMAL LOW (ref 3.87–5.11)
RDW: 25.3 % — ABNORMAL HIGH (ref 11.5–15.5)
WBC: 10.7 10*3/uL — AB (ref 4.0–10.5)

## 2014-08-14 LAB — RENAL FUNCTION PANEL
Albumin: 1.9 g/dL — ABNORMAL LOW (ref 3.5–5.2)
Anion gap: 12 (ref 5–15)
BUN: 44 mg/dL — AB (ref 6–23)
CALCIUM: 8.5 mg/dL (ref 8.4–10.5)
CO2: 23 mmol/L (ref 19–32)
Chloride: 102 mmol/L (ref 96–112)
Creatinine, Ser: 7.32 mg/dL — ABNORMAL HIGH (ref 0.50–1.10)
GFR calc non Af Amer: 5 mL/min — ABNORMAL LOW (ref >90)
GFR, EST AFRICAN AMERICAN: 6 mL/min — AB (ref >90)
GLUCOSE: 88 mg/dL (ref 70–99)
Phosphorus: 5.1 mg/dL — ABNORMAL HIGH (ref 2.3–4.6)
Potassium: 4.1 mmol/L (ref 3.5–5.1)
SODIUM: 137 mmol/L (ref 135–145)

## 2014-08-14 LAB — PROTIME-INR
INR: 3.02 — ABNORMAL HIGH (ref 0.00–1.49)
Prothrombin Time: 31.5 seconds — ABNORMAL HIGH (ref 11.6–15.2)

## 2014-08-14 MED ORDER — IOHEXOL 300 MG/ML  SOLN: 25.0000 mL | INTRAMUSCULAR | Status: AC

## 2014-08-15 ENCOUNTER — Other Ambulatory Visit (HOSPITAL_COMMUNITY): Payer: Self-pay

## 2014-08-15 LAB — LACTATE DEHYDROGENASE, PLEURAL OR PERITONEAL FLUID: LD, Fluid: 120 U/L — ABNORMAL HIGH (ref 3–23)

## 2014-08-15 LAB — BODY FLUID CELL COUNT WITH DIFFERENTIAL
LYMPHS FL: 49 %
Monocyte-Macrophage-Serous Fluid: 5 % — ABNORMAL LOW (ref 50–90)
Neutrophil Count, Fluid: 46 % — ABNORMAL HIGH (ref 0–25)
Total Nucleated Cell Count, Fluid: 124 cu mm (ref 0–1000)

## 2014-08-15 LAB — PROTIME-INR
INR: 3.52 — AB (ref 0.00–1.49)
PROTHROMBIN TIME: 35.6 s — AB (ref 11.6–15.2)

## 2014-08-15 LAB — GLUCOSE, SEROUS FLUID: GLUCOSE FL: 86 mg/dL

## 2014-08-15 MED ORDER — LIDOCAINE HCL (PF) 1 % IJ SOLN
INTRAMUSCULAR | Status: AC
  Filled 2014-08-15: qty 10

## 2014-08-15 NOTE — Procedures (Signed)
   US guided L thora  600 cc yellow fluid Sent for labs  Pt did well  cxr pending

## 2014-08-16 LAB — RENAL FUNCTION PANEL
ALBUMIN: 1.8 g/dL — AB (ref 3.5–5.2)
Anion gap: 12 (ref 5–15)
BUN: 36 mg/dL — AB (ref 6–23)
CO2: 23 mmol/L (ref 19–32)
Calcium: 8.6 mg/dL (ref 8.4–10.5)
Chloride: 105 mmol/L (ref 96–112)
Creatinine, Ser: 5.83 mg/dL — ABNORMAL HIGH (ref 0.50–1.10)
GFR calc non Af Amer: 7 mL/min — ABNORMAL LOW (ref >90)
GFR, EST AFRICAN AMERICAN: 8 mL/min — AB (ref >90)
GLUCOSE: 95 mg/dL (ref 70–99)
PHOSPHORUS: 4.2 mg/dL (ref 2.3–4.6)
Potassium: 3.9 mmol/L (ref 3.5–5.1)
SODIUM: 140 mmol/L (ref 135–145)

## 2014-08-16 LAB — PROTIME-INR
INR: 4.05 — AB (ref 0.00–1.49)
Prothrombin Time: 39.6 seconds — ABNORMAL HIGH (ref 11.6–15.2)

## 2014-08-16 LAB — CBC
HEMATOCRIT: 29.2 % — AB (ref 36.0–46.0)
Hemoglobin: 8.5 g/dL — ABNORMAL LOW (ref 12.0–15.0)
MCH: 25.3 pg — ABNORMAL LOW (ref 26.0–34.0)
MCHC: 29.1 g/dL — AB (ref 30.0–36.0)
MCV: 86.9 fL (ref 78.0–100.0)
Platelets: 298 10*3/uL (ref 150–400)
RBC: 3.36 MIL/uL — ABNORMAL LOW (ref 3.87–5.11)
RDW: 25.8 % — AB (ref 11.5–15.5)
WBC: 11.2 10*3/uL — AB (ref 4.0–10.5)

## 2014-08-16 LAB — PATHOLOGIST SMEAR REVIEW

## 2014-08-17 LAB — PROTIME-INR
INR: 4.84 — ABNORMAL HIGH (ref 0.00–1.49)
PROTHROMBIN TIME: 45.6 s — AB (ref 11.6–15.2)

## 2014-08-17 LAB — RENAL FUNCTION PANEL
Albumin: 1.9 g/dL — ABNORMAL LOW (ref 3.5–5.2)
Anion gap: 11 (ref 5–15)
BUN: 22 mg/dL (ref 6–23)
CO2: 25 mmol/L (ref 19–32)
CREATININE: 4.47 mg/dL — AB (ref 0.50–1.10)
Calcium: 8.2 mg/dL — ABNORMAL LOW (ref 8.4–10.5)
Chloride: 105 mmol/L (ref 96–112)
GFR calc Af Amer: 11 mL/min — ABNORMAL LOW (ref >90)
GFR, EST NON AFRICAN AMERICAN: 10 mL/min — AB (ref >90)
Glucose, Bld: 89 mg/dL (ref 70–99)
POTASSIUM: 4.4 mmol/L (ref 3.5–5.1)
Phosphorus: 3.7 mg/dL (ref 2.3–4.6)
SODIUM: 141 mmol/L (ref 135–145)

## 2014-08-18 ENCOUNTER — Other Ambulatory Visit (HOSPITAL_COMMUNITY): Payer: Self-pay

## 2014-08-18 LAB — CULTURE, BLOOD (ROUTINE X 2)
Culture: NO GROWTH
Culture: NO GROWTH

## 2014-08-18 LAB — CBC
HCT: 28.4 % — ABNORMAL LOW (ref 36.0–46.0)
Hemoglobin: 8.4 g/dL — ABNORMAL LOW (ref 12.0–15.0)
MCH: 25.9 pg — AB (ref 26.0–34.0)
MCHC: 29.6 g/dL — ABNORMAL LOW (ref 30.0–36.0)
MCV: 87.7 fL (ref 78.0–100.0)
PLATELETS: 247 10*3/uL (ref 150–400)
RBC: 3.24 MIL/uL — ABNORMAL LOW (ref 3.87–5.11)
RDW: 25.8 % — ABNORMAL HIGH (ref 11.5–15.5)
WBC: 11.3 10*3/uL — AB (ref 4.0–10.5)

## 2014-08-18 LAB — PROTIME-INR
INR: 1.95 — AB (ref 0.00–1.49)
Prothrombin Time: 22.4 seconds — ABNORMAL HIGH (ref 11.6–15.2)

## 2014-08-19 ENCOUNTER — Other Ambulatory Visit (HOSPITAL_COMMUNITY): Payer: Self-pay

## 2014-08-19 LAB — RENAL FUNCTION PANEL
Albumin: 1.8 g/dL — ABNORMAL LOW (ref 3.5–5.2)
Anion gap: 6 (ref 5–15)
BUN: 41 mg/dL — ABNORMAL HIGH (ref 6–23)
CALCIUM: 8.5 mg/dL (ref 8.4–10.5)
CO2: 27 mmol/L (ref 19–32)
Chloride: 107 mmol/L (ref 96–112)
Creatinine, Ser: 6.08 mg/dL — ABNORMAL HIGH (ref 0.50–1.10)
GFR, EST AFRICAN AMERICAN: 8 mL/min — AB (ref >90)
GFR, EST NON AFRICAN AMERICAN: 7 mL/min — AB (ref >90)
GLUCOSE: 87 mg/dL (ref 70–99)
POTASSIUM: 3.9 mmol/L (ref 3.5–5.1)
Phosphorus: 4.6 mg/dL (ref 2.3–4.6)
Sodium: 140 mmol/L (ref 135–145)

## 2014-08-19 LAB — CBC
HEMATOCRIT: 28.8 % — AB (ref 36.0–46.0)
HEMATOCRIT: 30.2 % — AB (ref 36.0–46.0)
HEMOGLOBIN: 8.5 g/dL — AB (ref 12.0–15.0)
Hemoglobin: 8.6 g/dL — ABNORMAL LOW (ref 12.0–15.0)
MCH: 25.4 pg — ABNORMAL LOW (ref 26.0–34.0)
MCH: 24.7 pg — AB (ref 26.0–34.0)
MCHC: 29.5 g/dL — ABNORMAL LOW (ref 30.0–36.0)
MCHC: 28.5 g/dL — ABNORMAL LOW (ref 30.0–36.0)
MCV: 86 fL (ref 78.0–100.0)
MCV: 86.8 fL (ref 78.0–100.0)
PLATELETS: 290 10*3/uL (ref 150–400)
Platelets: 209 10*3/uL (ref 150–400)
RBC: 3.35 MIL/uL — AB (ref 3.87–5.11)
RBC: 3.48 MIL/uL — ABNORMAL LOW (ref 3.87–5.11)
RDW: 26.3 % — ABNORMAL HIGH (ref 11.5–15.5)
RDW: 25.8 % — ABNORMAL HIGH (ref 11.5–15.5)
WBC: 11.6 10*3/uL — ABNORMAL HIGH (ref 4.0–10.5)
WBC: 11.7 10*3/uL — ABNORMAL HIGH (ref 4.0–10.5)

## 2014-08-19 LAB — PROTIME-INR
INR: 1.39 (ref 0.00–1.49)
PROTHROMBIN TIME: 17.2 s — AB (ref 11.6–15.2)

## 2014-08-19 LAB — BODY FLUID CULTURE
Culture: NO GROWTH
GRAM STAIN: NONE SEEN

## 2014-08-19 MED ORDER — MIDAZOLAM HCL 2 MG/2ML IJ SOLN
INTRAMUSCULAR | Status: AC
  Filled 2014-08-19: qty 4

## 2014-08-19 MED ORDER — HEPARIN SODIUM (PORCINE) 1000 UNIT/ML IJ SOLN
INTRAMUSCULAR | Status: AC
  Filled 2014-08-19: qty 1

## 2014-08-19 MED ORDER — HEPARIN SODIUM (PORCINE) 1000 UNIT/ML IJ SOLN
INTRAMUSCULAR | Status: DC | PRN
  Administered 2014-08-19: 2000 [IU] via INTRAVENOUS

## 2014-08-19 MED ORDER — FENTANYL CITRATE 0.05 MG/ML IJ SOLN
INTRAMUSCULAR | Status: AC
  Filled 2014-08-19: qty 2

## 2014-08-19 MED ORDER — FENTANYL CITRATE 0.05 MG/ML IJ SOLN
INTRAMUSCULAR | Status: AC | PRN
  Administered 2014-08-19 (×2): 12.5 ug via INTRAVENOUS
  Administered 2014-08-19: 25 ug via INTRAVENOUS

## 2014-08-19 MED ORDER — MIDAZOLAM HCL 2 MG/2ML IJ SOLN
INTRAMUSCULAR | Status: AC | PRN
  Administered 2014-08-19: 1 mg via INTRAVENOUS
  Administered 2014-08-19 (×2): 0.5 mg via INTRAVENOUS

## 2014-08-19 MED ORDER — SODIUM CHLORIDE 0.9 % IV SOLN
INTRAVENOUS | Status: AC | PRN
  Administered 2014-08-19: 10 mL/h via INTRAVENOUS

## 2014-08-19 NOTE — Consult Note (Signed)
Chief Complaint: Abd pain  Referring Physician(s): Hijazi  History of Present Illness: Andrea Vega is a 64 y.o. female   Pt with Hx Colon Ca Colostomy ?date Developed abd pain and leukocytosis CT scan 4/6 reveals subcapsular splenic abscess Request made for drain placement Dr Annamaria Boots reviewed imaging and approves procedure I have seen and examined pt Scheduled now for today----has been off coumadin (a flutter) INR 1.39 today  Past Medical History  Diagnosis Date  . Atrial flutter     a. post op and in the setting of sepsis; b. not on long term anticoagulation; c. echo 07/2014: EF 55-60%, technically difficult study  . Obesity   . Diabetes mellitus   . GERD (gastroesophageal reflux disease)   . HTN (hypertension)     No past surgical history on file.  Allergies: Review of patient's allergies indicates not on file.  Medications: Prior to Admission medications   Not on File     No family history on file.  History   Social History  . Marital Status: Married    Spouse Name: N/A  . Number of Children: N/A  . Years of Education: N/A   Social History Main Topics  . Smoking status: Not on file  . Smokeless tobacco: Not on file  . Alcohol Use: Not on file  . Drug Use: Not on file  . Sexual Activity: Not on file   Other Topics Concern  . Not on file   Social History Narrative  . No narrative on file    Review of Systems: A 12 point ROS discussed and pertinent positives are indicated in the HPI above.  All other systems are negative.  Review of Systems  Constitutional: Positive for activity change, appetite change and fatigue. Negative for fever.  Respiratory: Negative for shortness of breath.   Gastrointestinal: Positive for nausea and abdominal pain.  Musculoskeletal: Positive for gait problem.  Neurological: Positive for weakness.  Psychiatric/Behavioral: Negative for behavioral problems and confusion.    Vital Signs: BP 124/50  mmHg  Physical Exam  Constitutional: She is oriented to person, place, and time.  Cardiovascular: Normal rate, regular rhythm and normal heart sounds.   No murmur heard. Pulmonary/Chest: Effort normal. She has wheezes.  Abdominal: Soft. Bowel sounds are normal. There is tenderness.  Musculoskeletal: Normal range of motion.  Neurological: She is alert and oriented to person, place, and time.  Skin: Skin is warm and dry.  Psychiatric: She has a normal mood and affect. Her behavior is normal. Judgment and thought content normal.  Nursing note and vitals reviewed.   Mallampati Score:  MD Evaluation Airway: WNL Heart: WNL Abdomen: WNL Chest/ Lungs: WNL ASA  Classification: 3 Mallampati/Airway Score: Two  Imaging: Ct Abdomen Pelvis Wo Contrast  08/14/2014   CLINICAL DATA:  Hospitalized for 5 weeks.  [facility] patient.  EXAM: CT ABDOMEN AND PELVIS WITHOUT CONTRAST  TECHNIQUE: Multidetector CT imaging of the abdomen and pelvis was performed following the standard protocol without IV contrast.  COMPARISON:  07/31/2014  FINDINGS: Lower chest: There is a moderate, partially loculated left pleural effusion which appears similar in volume to the previous exam. Small simple appearing or right pleural effusion noted. Airspace consolidation overlying the pleural effusions noted bilaterally.  Hepatobiliary: No focal liver abnormality. Previous cholecystectomy. No biliary dilatation.  Pancreas: Unremarkable appearance of the pancreas.  Spleen: Perisplenic fluid collection is again identified. The medial component measures 7.8 x 3.4 cm, image 12/series 2. This is unchanged from previous exam.  The posterior lateral component measures 10.4 x 4.3 cm, image 21/series 2. Previously 10.8 x 3.7 cm.  Adrenals/Urinary Tract: Normal appearance of the right adrenal gland. Stable left adrenal nodule measuring 1.6 x 2.6 cm. Normal appearance of the left kidney. Low-attenuation structure within the inferior pole of  the right kidney is unchanged measuring 1.9 cm, image 35/series 2. The urinary bladder is collapsed around a Foley catheter.  Stomach/Bowel: Normal appearance of the stomach. The small bowel loops have a normal caliber without evidence for obstruction. There is a left lower quadrant colostomy. No evidence for bowel obstruction. The ingested enteric contrast material is noted within the distal colon as well as the colostomy bag.  Vascular/Lymphatic: Calcified atherosclerotic disease involves the abdominal aorta. No aneurysm. Prominent retroperitoneal lymph nodes are day and identified. Index periaortic lymph node measures 1.2 cm, image 37/series 2. This is similar to previous exam.  Reproductive: Normal appearance of the uterus. Enlarged right ovary. This is of uncertain clinical significance and difficult to evaluate secondary to lack of IV contrast material, image 68/series 2  Other: A small amount of free fluid is identified within the dependent portion of the pelvis and along the inferior margin of the right lobe of liver. Areas of loculated fluid within the left hemi abdomen are again noted. Previously referenced loculated fluid adjacent to the left kidney measures 3.5 x 6.6 x 9.2 cm, image 37/series 2. Previously this measured 3.8 x 6.8 x 8.4 cm.  Musculoskeletal: Multi level degenerative disc disease noted within the lower thoracic and throughout the lumbar spine. There is advanced degenerative changes involving the right hip.  IMPRESSION: 1. Fluid collections surround the spleen and left side of abdomen are not significantly changed in volume compared with previous exam. 2. Loculated left pleural effusion is not significantly changed in volume from previous study. 3. Left lower quadrant colostomy is patent without evidence for bowel obstruction. 4. Asymmetric enlargement of the right ovary of on certain clinical significance. 5. Stable borderline enlarged retroperitoneal lymph nodes. 6. Atherosclerotic  disease.   Electronically Signed   By: Kerby Moors M.D.   On: 08/14/2014 18:46   Dg Chest 1 View  08/15/2014   CLINICAL DATA:  Status post left thoracentesis.  EXAM: CHEST  1 VIEW  COMPARISON:  Plain film of the chest 08/12/2014. CT abdomen and pelvis 08/14/2014.  FINDINGS: Left pleural effusion is decreased after thoracentesis. No pneumothorax. The right lung is clear. Heart size is upper normal. Dialysis catheter is noted.  IMPRESSION: Decreased left pleural effusion after thoracentesis. No pneumothorax.   Electronically Signed   By: Inge Rise M.D.   On: 08/15/2014 13:46   Dg Chest Port 1 View  08/18/2014   CLINICAL DATA:  Respiratory distress  EXAM: PORTABLE CHEST - 1 VIEW  COMPARISON:  08/15/2014  FINDINGS: Dialysis catheter from right IJ approach is in unremarkable position.  Unchanged cardiopericardial enlargement. Stable aortic and hilar contours where visualized. There is a layering left pleural effusion which reaches the apex. Progressive interstitial coarsening diffusely.  IMPRESSION: 1. Developing pulmonary edema. 2. Moderate layering left pleural effusion, similar to 08/15/2014.   Electronically Signed   By: Monte Fantasia M.D.   On: 08/18/2014 22:41   Dg Chest Port 1 View  08/12/2014   CLINICAL DATA:  Respiratory failure  EXAM: PORTABLE CHEST - 1 VIEW  COMPARISON:  Portable chest x-ray of July 24, 2014  FINDINGS: The right lung is adequately inflated and clear. On the left there is persistent moderate-sized pleural  effusion layering posteriorly. The cardiac silhouette remains enlarged. There is mild stable mediastinal shift toward the right. The dialysis type catheter is unchanged in position.  IMPRESSION: Stable appearance of the chest since March 16th. A moderate size left pleural effusion persists. There is a stable mild mediastinal shift to the right.   Electronically Signed   By: David  Martinique   On: 08/12/2014 07:40   US Thoracentesis Asp Pleural Space W/img Guide  08/15/2014    INDICATION: Symptomatic L sided pleural effusion  EXAM: US THORACENTESIS ASP PLEURAL SPACE W/IMG GUIDE  COMPARISON:  None.  MEDICATIONS: 10 cc 1% lidocaine  COMPLICATIONS: None immediate  TECHNIQUE: Informed written consent was obtained from the patient after a discussion of the risks, benefits and alternatives to treatment. A timeout was performed prior to the initiation of the procedure.  Initial ultrasound scanning demonstrates a left pleural effusion. The lower chest was prepped and draped in the usual sterile fashion. 1% lidocaine was used for local anesthesia.  Under direct ultrasound guidance, a 19 gauge, 7-cm, Yueh catheter was introduced. An ultrasound image was saved for documentation purposes. The thoracentesis was performed. The catheter was removed and a dressing was applied. The patient tolerated the procedure well without immediate post procedural complication. The patient was escorted to have an upright chest radiograph.  FINDINGS: A total of approximately 600 cc of yellow fluid was removed. Requested samples were sent to the laboratory.  IMPRESSION: Successful ultrasound-guided L sided thoracentesis yielding 600 cc of pleural fluid.  Read by:  Lavonia Drafts [facility]   Electronically Signed   By: Markus Daft M.D.   On: 08/15/2014 14:07    Labs:  CBC:  Recent Labs  08/16/14 0525 08/17/14 0550 08/18/14 0923 08/19/14 0550  WBC 11.2* 11.6* 11.3* 11.7*  HGB 8.5* 8.5* 8.4* 8.6*  HCT 29.2* 28.8* 28.4* 30.2*  PLT 298 209 247 290    COAGS:  Recent Labs  08/16/14 0525 08/17/14 0550 08/18/14 0923 08/19/14 0550  INR 4.05* 4.84* 1.95* 1.39    BMP:  Recent Labs  08/14/14 0620 08/16/14 0525 08/17/14 0550 08/19/14 0550  NA 137 140 141 140  K 4.1 3.9 4.4 3.9  CL 102 105 105 107  CO2 23 23 25 27   GLUCOSE 88 95 89 87  BUN 44* 36* 22 41*  CALCIUM 8.5 8.6 8.2* 8.5  CREATININE 7.32* 5.83* 4.47* 6.08*  GFRNONAA 5* 7* 10* 7*  GFRAA 6* 8* 11* 8*    LIVER FUNCTION TESTS:  Recent  Labs  08/07/14 0531 08/10/14 0530 08/14/14 0620 08/16/14 0525 08/17/14 0550 08/19/14 0550  BILITOT 0.5 0.4  --   --   --   --   AST 19 17  --   --   --   --   ALT 19 18  --   --   --   --   ALKPHOS 142* 139*  --   --   --   --   PROT 5.6* 5.5*  --   --   --   --   ALBUMIN 1.9* 1.7* 1.9* 1.8* 1.9* 1.8*    TUMOR MARKERS: No results for input(s): AFPTM, CEA, CA199, CHROMGRNA in the last 8760 hours.  Assessment and Plan:  Colon ca Colostomy Intra abdominal abscess Scheduled for drain placement in Radiology today Risks and Benefits discussed with the patient including bleeding, infection, damage to adjacent structures, bowel perforation/fistula connection, and sepsis. All of the patient's questions were answered, patient is agreeable to proceed.  Consent signed and in chart.   Thank you for this interesting consult.  I greatly enjoyed meeting Lameshia A Encarnacion and look forward to participating in their care.  Signed: Sunday Klos A 08/19/2014, 7:59 AM   I spent a total of 40 Minutes  in face to face in clinical consultation, greater than 50% of which was counseling/coordinating care for intraabdominal abscess drain placement

## 2014-08-19 NOTE — Procedures (Signed)
Interventional Radiology Procedure Note  Procedure: Drain #1: Subcapsular splenic abscess.  Aspiration yields 120 mL purulent fluid.  31F drain left in place to JP bulb.  Drain #2: Left paracolic gutter abscess.  Aspiration yields 60 mL purulent fluid.  31F drain left to JP bulb.   Complications: None  Estimated Blood Loss: None  Recommendations: - Drains to JP bulbs. - Follow cultures - Repeat CT abd with IV contrast prior to drain DC   Signed,  Criselda Peaches, MD

## 2014-08-20 LAB — BASIC METABOLIC PANEL
Anion gap: 10 (ref 5–15)
BUN: 27 mg/dL — ABNORMAL HIGH (ref 6–23)
CO2: 27 mmol/L (ref 19–32)
Calcium: 8.4 mg/dL (ref 8.4–10.5)
Chloride: 103 mmol/L (ref 96–112)
Creatinine, Ser: 4.41 mg/dL — ABNORMAL HIGH (ref 0.50–1.10)
GFR calc Af Amer: 11 mL/min — ABNORMAL LOW (ref >90)
GFR calc non Af Amer: 10 mL/min — ABNORMAL LOW (ref >90)
Glucose, Bld: 90 mg/dL (ref 70–99)
POTASSIUM: 3.7 mmol/L (ref 3.5–5.1)
SODIUM: 140 mmol/L (ref 135–145)

## 2014-08-20 LAB — PROTIME-INR
INR: 1.32 (ref 0.00–1.49)
Prothrombin Time: 16.5 seconds — ABNORMAL HIGH (ref 11.6–15.2)

## 2014-08-21 LAB — RENAL FUNCTION PANEL
ANION GAP: 13 (ref 5–15)
Albumin: 1.8 g/dL — ABNORMAL LOW (ref 3.5–5.2)
BUN: 38 mg/dL — AB (ref 6–23)
CHLORIDE: 100 mmol/L (ref 96–112)
CO2: 26 mmol/L (ref 19–32)
Calcium: 8.3 mg/dL — ABNORMAL LOW (ref 8.4–10.5)
Creatinine, Ser: 5.09 mg/dL — ABNORMAL HIGH (ref 0.50–1.10)
GFR calc Af Amer: 10 mL/min — ABNORMAL LOW (ref >90)
GFR calc non Af Amer: 8 mL/min — ABNORMAL LOW (ref >90)
GLUCOSE: 96 mg/dL (ref 70–99)
POTASSIUM: 3.5 mmol/L (ref 3.5–5.1)
Phosphorus: 4.2 mg/dL (ref 2.3–4.6)
Sodium: 139 mmol/L (ref 135–145)

## 2014-08-21 LAB — CBC
HCT: 28 % — ABNORMAL LOW (ref 36.0–46.0)
HEMATOCRIT: 27.8 % — AB (ref 36.0–46.0)
HEMOGLOBIN: 8.2 g/dL — AB (ref 12.0–15.0)
Hemoglobin: 8 g/dL — ABNORMAL LOW (ref 12.0–15.0)
MCH: 25.7 pg — AB (ref 26.0–34.0)
MCH: 25.5 pg — ABNORMAL LOW (ref 26.0–34.0)
MCHC: 29.3 g/dL — ABNORMAL LOW (ref 30.0–36.0)
MCHC: 28.8 g/dL — ABNORMAL LOW (ref 30.0–36.0)
MCV: 87.8 fL (ref 78.0–100.0)
MCV: 88.5 fL (ref 78.0–100.0)
PLATELETS: 216 10*3/uL (ref 150–400)
Platelets: 201 10*3/uL (ref 150–400)
RBC: 3.19 MIL/uL — AB (ref 3.87–5.11)
RBC: 3.14 MIL/uL — AB (ref 3.87–5.11)
RDW: 25.6 % — ABNORMAL HIGH (ref 11.5–15.5)
RDW: 25.6 % — AB (ref 11.5–15.5)
WBC: 12.3 10*3/uL — ABNORMAL HIGH (ref 4.0–10.5)
WBC: 11.4 10*3/uL — AB (ref 4.0–10.5)

## 2014-08-21 LAB — PROTIME-INR
INR: 1.44 (ref 0.00–1.49)
Prothrombin Time: 17.7 seconds — ABNORMAL HIGH (ref 11.6–15.2)

## 2014-08-22 LAB — CULTURE, ROUTINE-ABSCESS
Culture: NO GROWTH
Culture: NO GROWTH

## 2014-08-22 LAB — PROTIME-INR
INR: 1.58 — ABNORMAL HIGH (ref 0.00–1.49)
Prothrombin Time: 19 seconds — ABNORMAL HIGH (ref 11.6–15.2)

## 2014-08-23 LAB — CBC
HEMATOCRIT: 25.7 % — AB (ref 36.0–46.0)
HEMOGLOBIN: 7.6 g/dL — AB (ref 12.0–15.0)
MCH: 26.2 pg (ref 26.0–34.0)
MCHC: 29.6 g/dL — ABNORMAL LOW (ref 30.0–36.0)
MCV: 88.6 fL (ref 78.0–100.0)
Platelets: 169 10*3/uL (ref 150–400)
RBC: 2.9 MIL/uL — ABNORMAL LOW (ref 3.87–5.11)
RDW: 25.5 % — ABNORMAL HIGH (ref 11.5–15.5)
WBC: 10 10*3/uL (ref 4.0–10.5)

## 2014-08-23 LAB — RENAL FUNCTION PANEL
ALBUMIN: 1.8 g/dL — AB (ref 3.5–5.2)
ANION GAP: 13 (ref 5–15)
BUN: 33 mg/dL — AB (ref 6–23)
CHLORIDE: 102 mmol/L (ref 96–112)
CO2: 25 mmol/L (ref 19–32)
Calcium: 8.4 mg/dL (ref 8.4–10.5)
Creatinine, Ser: 4.78 mg/dL — ABNORMAL HIGH (ref 0.50–1.10)
GFR calc non Af Amer: 9 mL/min — ABNORMAL LOW (ref >90)
GFR, EST AFRICAN AMERICAN: 10 mL/min — AB (ref >90)
Glucose, Bld: 82 mg/dL (ref 70–99)
POTASSIUM: 3.8 mmol/L (ref 3.5–5.1)
Phosphorus: 4.5 mg/dL (ref 2.3–4.6)
Sodium: 140 mmol/L (ref 135–145)

## 2014-08-23 LAB — PROTIME-INR
INR: 1.85 — ABNORMAL HIGH (ref 0.00–1.49)
PROTHROMBIN TIME: 21.5 s — AB (ref 11.6–15.2)

## 2014-08-24 LAB — HEMOGLOBIN AND HEMATOCRIT, BLOOD
HCT: 25.8 % — ABNORMAL LOW (ref 36.0–46.0)
Hemoglobin: 7.6 g/dL — ABNORMAL LOW (ref 12.0–15.0)

## 2014-08-24 LAB — PROTIME-INR
INR: 2.18 — AB (ref 0.00–1.49)
Prothrombin Time: 24.4 seconds — ABNORMAL HIGH (ref 11.6–15.2)

## 2014-08-25 LAB — PROTIME-INR
INR: 2.66 — AB (ref 0.00–1.49)
Prothrombin Time: 28.6 seconds — ABNORMAL HIGH (ref 11.6–15.2)

## 2014-08-26 ENCOUNTER — Other Ambulatory Visit (HOSPITAL_COMMUNITY): Payer: Self-pay

## 2014-08-26 LAB — CBC
HEMATOCRIT: 24.6 % — AB (ref 36.0–46.0)
Hemoglobin: 7.3 g/dL — ABNORMAL LOW (ref 12.0–15.0)
MCH: 26.2 pg (ref 26.0–34.0)
MCHC: 29.7 g/dL — AB (ref 30.0–36.0)
MCV: 88.2 fL (ref 78.0–100.0)
Platelets: 207 10*3/uL (ref 150–400)
RBC: 2.79 MIL/uL — ABNORMAL LOW (ref 3.87–5.11)
RDW: 25.5 % — ABNORMAL HIGH (ref 11.5–15.5)
WBC: 10.1 10*3/uL (ref 4.0–10.5)

## 2014-08-26 LAB — RENAL FUNCTION PANEL
ANION GAP: 13 (ref 5–15)
Albumin: 2.2 g/dL — ABNORMAL LOW (ref 3.5–5.2)
BUN: 39 mg/dL — AB (ref 6–23)
CO2: 20 mmol/L (ref 19–32)
CREATININE: 4.68 mg/dL — AB (ref 0.50–1.10)
Calcium: 9 mg/dL (ref 8.4–10.5)
Chloride: 108 mmol/L (ref 96–112)
GFR calc Af Amer: 11 mL/min — ABNORMAL LOW (ref >90)
GFR calc non Af Amer: 9 mL/min — ABNORMAL LOW (ref >90)
GLUCOSE: 88 mg/dL (ref 70–99)
PHOSPHORUS: 5 mg/dL — AB (ref 2.3–4.6)
POTASSIUM: 3.8 mmol/L (ref 3.5–5.1)
Sodium: 141 mmol/L (ref 135–145)

## 2014-08-26 LAB — PROTIME-INR
INR: 2.55 — ABNORMAL HIGH (ref 0.00–1.49)
Prothrombin Time: 27.6 seconds — ABNORMAL HIGH (ref 11.6–15.2)

## 2014-08-27 ENCOUNTER — Other Ambulatory Visit (HOSPITAL_COMMUNITY): Payer: Self-pay

## 2014-08-27 LAB — BODY FLUID CELL COUNT WITH DIFFERENTIAL
EOS FL: 1 %
Lymphs, Fluid: 63 %
Monocyte-Macrophage-Serous Fluid: 32 % — ABNORMAL LOW (ref 50–90)
Neutrophil Count, Fluid: 4 % (ref 0–25)
Total Nucleated Cell Count, Fluid: 118 cu mm (ref 0–1000)

## 2014-08-27 LAB — PROTIME-INR
INR: 2.53 — AB (ref 0.00–1.49)
PROTHROMBIN TIME: 27.5 s — AB (ref 11.6–15.2)

## 2014-08-27 MED ORDER — LIDOCAINE HCL (PF) 1 % IJ SOLN
INTRAMUSCULAR | Status: AC
  Filled 2014-08-27: qty 10

## 2014-08-27 NOTE — Procedures (Signed)
Successful US guided left thoracentesis. Yielded 1L of clear yellow fluid. Pt tolerated procedure well. No immediate complications.  Specimen was sent for labs. CXR ordered.  Ascencion Dike PA-C 08/27/2014 11:17 AM

## 2014-08-28 LAB — RENAL FUNCTION PANEL
ALBUMIN: 2.1 g/dL — AB (ref 3.5–5.2)
Anion gap: 14 (ref 5–15)
BUN: 32 mg/dL — AB (ref 6–23)
CO2: 21 mmol/L (ref 19–32)
CREATININE: 3.86 mg/dL — AB (ref 0.50–1.10)
Calcium: 8.5 mg/dL (ref 8.4–10.5)
Chloride: 106 mmol/L (ref 96–112)
GFR calc Af Amer: 13 mL/min — ABNORMAL LOW (ref >90)
GFR calc non Af Amer: 12 mL/min — ABNORMAL LOW (ref >90)
Glucose, Bld: 74 mg/dL (ref 70–99)
PHOSPHORUS: 3.9 mg/dL (ref 2.3–4.6)
POTASSIUM: 3.6 mmol/L (ref 3.5–5.1)
Sodium: 141 mmol/L (ref 135–145)

## 2014-08-28 LAB — PATHOLOGIST SMEAR REVIEW

## 2014-08-28 LAB — CBC
HCT: 26.9 % — ABNORMAL LOW (ref 36.0–46.0)
Hemoglobin: 8.1 g/dL — ABNORMAL LOW (ref 12.0–15.0)
MCH: 26.3 pg (ref 26.0–34.0)
MCHC: 30.1 g/dL (ref 30.0–36.0)
MCV: 87.3 fL (ref 78.0–100.0)
PLATELETS: 231 10*3/uL (ref 150–400)
RBC: 3.08 MIL/uL — AB (ref 3.87–5.11)
RDW: 25.6 % — ABNORMAL HIGH (ref 11.5–15.5)
WBC: 9.9 10*3/uL (ref 4.0–10.5)

## 2014-08-28 LAB — PROTIME-INR
INR: 2.64 — AB (ref 0.00–1.49)
Prothrombin Time: 28.4 seconds — ABNORMAL HIGH (ref 11.6–15.2)

## 2014-08-29 ENCOUNTER — Other Ambulatory Visit (HOSPITAL_COMMUNITY): Payer: Self-pay

## 2014-08-29 LAB — PROTIME-INR
INR: 2.58 — ABNORMAL HIGH (ref 0.00–1.49)
Prothrombin Time: 27.9 seconds — ABNORMAL HIGH (ref 11.6–15.2)

## 2014-08-29 NOTE — Progress Notes (Signed)
  Echocardiogram 2D Echocardiogram has been performed.  Donata Clay 08/29/2014, 2:55 PM

## 2014-08-30 ENCOUNTER — Other Ambulatory Visit (HOSPITAL_COMMUNITY): Payer: Self-pay

## 2014-08-30 LAB — CBC
HCT: 26.6 % — ABNORMAL LOW (ref 36.0–46.0)
Hemoglobin: 7.8 g/dL — ABNORMAL LOW (ref 12.0–15.0)
MCH: 25.9 pg — ABNORMAL LOW (ref 26.0–34.0)
MCHC: 29.3 g/dL — ABNORMAL LOW (ref 30.0–36.0)
MCV: 88.4 fL (ref 78.0–100.0)
PLATELETS: 266 10*3/uL (ref 150–400)
RBC: 3.01 MIL/uL — ABNORMAL LOW (ref 3.87–5.11)
RDW: 25 % — AB (ref 11.5–15.5)
WBC: 9.9 10*3/uL (ref 4.0–10.5)

## 2014-08-30 LAB — RENAL FUNCTION PANEL
ALBUMIN: 1.9 g/dL — AB (ref 3.5–5.2)
Anion gap: 10 (ref 5–15)
BUN: 32 mg/dL — ABNORMAL HIGH (ref 6–23)
CALCIUM: 8.4 mg/dL (ref 8.4–10.5)
CO2: 26 mmol/L (ref 19–32)
Chloride: 103 mmol/L (ref 96–112)
Creatinine, Ser: 3.72 mg/dL — ABNORMAL HIGH (ref 0.50–1.10)
GFR calc Af Amer: 14 mL/min — ABNORMAL LOW (ref >90)
GFR calc non Af Amer: 12 mL/min — ABNORMAL LOW (ref >90)
GLUCOSE: 101 mg/dL — AB (ref 70–99)
PHOSPHORUS: 3.6 mg/dL (ref 2.3–4.6)
POTASSIUM: 3.4 mmol/L — AB (ref 3.5–5.1)
SODIUM: 139 mmol/L (ref 135–145)

## 2014-08-30 LAB — PROTIME-INR
INR: 2.9 — ABNORMAL HIGH (ref 0.00–1.49)
PROTHROMBIN TIME: 30.5 s — AB (ref 11.6–15.2)

## 2014-08-31 ENCOUNTER — Other Ambulatory Visit (HOSPITAL_COMMUNITY): Payer: Self-pay

## 2014-08-31 LAB — PROTIME-INR
INR: 3.94 — AB (ref 0.00–1.49)
Prothrombin Time: 38.8 seconds — ABNORMAL HIGH (ref 11.6–15.2)

## 2014-08-31 LAB — CLOSTRIDIUM DIFFICILE BY PCR: Toxigenic C. Difficile by PCR: NEGATIVE

## 2014-08-31 NOTE — H&P (Signed)
Subjective/Chief Complaint ruq abdominal pain since friday   History of Present Illness 64 y/o diabetic presents with several day history of constant RUQ abdominal pain, nausea and emesis.  No sick contacts No jaundice and no fevers.  W/u in ER with CT and [place] c/w acute cholecystitis. CT scan also showed small adrenal lesion which will need f/u as outpatient.   She has a prior history of diverticulitis.  No prior colonoscopy per her history.   Past History obesity diverticulitis tobacco abuse and dependence. type II DM. HTN   Past Medical Health Hypertension, Diabetes Mellitus, Smoking   Primary [facility], PCP   Code Status Full Code   ALLERGIES:  Penicillin: Hives  HOME MEDICATIONS: Medication Instructions Status  meloxicam 15 mg oral tablet 1 tab(s) orally once a day Active  metFORMIN 500 mg oral tablet, extended release 1 tab(s) orally once a day Active  omeprazole 20 mg oral delayed release capsule 1 cap(s) orally once a day Active  hydrochlorothiazide 25 mg oral tablet 1 tab(s) orally once a day Active  sertraline 50 mg oral tablet 1 tab(s) orally once a day Active  acetaminophen-HYDROcodone 325 mg-5 mg oral tablet 1 tab(s) orally every 6 hours Active   Family and Social History:  Family History Non-Contributory   Social History positive  tobacco, positive  tobacco (Current within 1 year), negative ETOH   + Tobacco Current (within 1 year)   Place of Living Home   Review of Systems:  Subjective/Chief Complaint see above.   Abdominal Pain Yes   Diarrhea Yes  last week but not now.   Nausea/Vomiting Yes   Tolerating Diet No  Nauseated   Medications/Allergies Reviewed Medications/Allergies reviewed   Physical Exam:  GEN no acute distress, obese, disheveled, temp 99.2 p72 bp 159/86 sats RA 94%   HEENT pale conjunctivae, PERRL   NECK supple   RESP normal resp effort  clear BS   CARD regular rate   ABD positive tenderness  no liver/spleen  enlargement  soft  positive Murphy's sign present,.   LYMPH negative neck   EXTR negative cyanosis/clubbing, negative edema   SKIN normal to palpation, No rashes   NEURO cranial nerves intact   PSYCH A+O to time, place, person, good insight   Lab Results:  Hepatic:  22-Dec-15 01:47   Bilirubin, Total 0.4  Alkaline Phosphatase  117 (46-116 NOTE: New Reference Range 11/27/13)  SGPT (ALT) 32 (14-63 NOTE: New Reference Range 11/27/13)  SGOT (AST) 18  Total Protein, Serum 8.1  Albumin, Serum 3.5  Cardiology:  22-Dec-15 01:51   Ventricular Rate 72  Atrial Rate 72  P-R Interval 148  QRS Duration 94  QT 404  QTc 442  P Axis 73  R Axis 38  T Axis 51  ECG interpretation Normal sinus rhythm Normal ECG No previous ECGs available ----------unconfirmed---------- Confirmed by OVERREAD, NOT ([id number]), editor PEARSON, BARBARA ([id number]) on 04/30/2014 10:05:36 AM  Routine Chem:  22-Dec-15 01:47   Lipase 109 (Result(s) reported on 30 Apr 2014 at 07:05AM.)  Glucose, Serum  180  BUN 15  Creatinine (comp) 0.81  Sodium, Serum  133  Potassium, Serum 3.7  Chloride, Serum  97  CO2, Serum 26  Calcium (Total), Serum 9.0  Osmolality (calc) 272  eGFR (African American) >60  eGFR (Non-African American) >60 (eGFR values <51m/min/1.73 m2 may be an indication of chronic kidney disease (CKD). Calculated eGFR, using the MRDR Study equation, is useful in  patients with stable renal function. The eGFR  calculation will not be reliable in acutely ill patients when serum creatinine is changing rapidly. It is not useful in patients on dialysis. The eGFR calculation may not be applicable to patients at the low and high extremes of body sizes, pregnant women, and vegetarians.)  Anion Gap 10  Cardiac:  22-Dec-15 01:47   Troponin I < 0.02 (0.00-0.05 0.05 ng/mL or less: NEGATIVE  Repeat testing in 3-6 hrs  if clinically indicated. >0.05 ng/mL: POTENTIAL  MYOCARDIAL INJURY. Repeat  testing in 3-6 hrs  if  clinically indicated. NOTE: An increase or decrease  of 30% or more on serial  testing suggests a  clinically important change)  Routine UA:  22-Dec-15 01:47   Color (UA) Yellow  Clarity (UA) Clear  Glucose (UA) Negative  Bilirubin (UA) Negative  Ketones (UA) 1+  Specific Gravity (UA) 1.020  Blood (UA) Negative  pH (UA) 5.0  Protein (UA) Negative  Nitrite (UA) Negative  Leukocyte Esterase (UA) Negative (Result(s) reported on 30 Apr 2014 at 02:31AM.)  RBC (UA) 1 /HPF  WBC (UA) 4 /HPF  Bacteria (UA) NONE SEEN  Epithelial Cells (UA) 2 /HPF (Result(s) reported on 30 Apr 2014 at 02:31AM.)  Routine Hem:  22-Dec-15 01:47   WBC (CBC)  21.3  RBC (CBC) 4.40  Hemoglobin (CBC)  11.9  Hematocrit (CBC) 37.3  Platelet Count (CBC) 311  MCV 85  MCH 27.1  MCHC 32.0  RDW  16.1  Neutrophil % 83.3  Lymphocyte % 10.2  Monocyte % 4.7  Eosinophil % 1.1  Basophil % 0.7  Neutrophil #  17.7  Lymphocyte # 2.2  Monocyte #  1.0  Eosinophil # 0.2  Basophil # 0.1 (Result(s) reported on 30 Apr 2014 at 02:03AM.)   Radiology Results: [place]:    22-Dec-15 08:00, US Abdomen Limited Survey  US Abdomen Limited Survey  REASON FOR EXAM:    GBLADDER HAZINESS SEEN ON CT; EVAL CHOLECYSTITIS  COMMENTS:   Body Site: Gallbladder, Liver, Common Bile Duct    PROCEDURE: [place]  - US ABDOMEN LIMITED SURVEY  - Apr 30 2014  8:00AM     CLINICAL DATA:  Lower quadrant pain, nausea andvomiting 1 week.  Gallbladder findings on recent CT scan.    EXAM:  US ABDOMEN LIMITED - RIGHT UPPER QUADRANT    COMPARISON:  CT 04/30/2014    FINDINGS:  Gallbladder:  Two moderate size gallstones are present over the dependent portion  of the gallbladder measuring 1.8 and 2 cm. Mild gallbladder wall  thickening measuring 4.5 mm. Suggestion of a tiny amount of  pericholecystic fluid. Negative sonographic Murphy sign.    Common bile duct:    Diameter: 3.5 mm.    Liver:    Moderate diffuse hepatic steatosis without focal  mass. Normal  direction of flow within the portal vein.     IMPRESSION:  Moderate cholelithiasis with associated gallbladder wall thickening  and possible small amount of pericholecystic fluid. Findings may be  due to acute cholecystitis as recommend clinical correlation.    Moderate diffuse hepatic steatosis without focal mass.      Electronically Signed    By: Marin Olp M.D.    On: 04/30/2014 08:07         Verified By: Pearletha Alfred, M.D.,  LabUnknown:    22-Dec-15 05:30, CT Abdomen and Pelvis With Contrast  PACS Image    22-Dec-15 08:00, US Abdomen Limited Survey  PACS Image  CT:    22-Dec-15 05:30, CT Abdomen and Pelvis With Contrast  CT Abdomen and Pelvis With Contrast  REASON FOR EXAM:    (1) RIGHT SIDE PAIN, WBC 21; (2) H/O DIVERTICULITIS   STATES FEELS SIMILAR;    NOT  COMMENTS:   May transport without cardiac monitor    PROCEDURE: CT  - CT ABDOMEN / PELVIS  W  - Apr 30 2014  5:30AM     CLINICAL DATA:  Acute onset of right upper quadrant abdominal pain,  nausea, vomiting and diarrhea. Initial encounter.    EXAM:  CT ABDOMEN AND PELVIS WITH CONTRAST    TECHNIQUE:  Multidetector CT imaging of the abdomen and pelvis was performed  using the standard protocol following bolus administration of  intravenous contrast.    CONTRAST:  125 mL of Omnipaque 300 IV contrast    COMPARISON:  Lumbar spine radiographs performed 05/05/2012    FINDINGS:  The visualized lung bases are clear.    There is diffuse fatty infiltration within the liver, with a few  areas of fatty sparing. A 0.9 cm hypodensity within the right  hepatic lobe is nonspecific. The spleen is unremarkable in  appearance.    There is vague haziness about the gallbladder, which may reflect  mild cholecystitis. The pancreas is unremarkable in appearance. A  nonspecific 2.7 cm left adrenal lesion is noted. The right adrenal  gland is unremarkable.    Scattered small bilateral renal cysts are seen,  measuring up to 1.7  cm in size. The kidneys are otherwise unremarkable. There is no  evidence of hydronephrosis. No renal or ureteral stones are seen. No  perinephric stranding is appreciated.    No free fluid is identified. The small bowel is unremarkable in  appearance. The stomach is within normal limits. No acute vascular  abnormalities are seen. Mild scattered calcification is seen along  the abdominal aorta and its branches.    The appendix is normal in caliber and contains air, without evidence  for appendicitis. Mild focal soft tissue inflammation is noted at  the mid descending colon, with irregular surrounding soft tissue  density. Though this could reflect mild diverticulitis or sequelae  of prior diverticulitis, an underlying mass cannot be excluded.    The colon is otherwise unremarkable.    The bladder is decompressed and not well assessed. The uterus is  unremarkable in appearance. The ovaries are relatively symmetric. No  suspicious adnexal masses are seen. No inguinal lymphadenopathy is  seen.    No acute osseous abnormalities are identified. Degenerative change  is noted at both hips, more prominent on the right. There is  flattening of the right femoral head, with diffuse subcortical  cystic change. Multilevel vacuum phenomenon is noted along the lower  thoracic and lumbar spine.     IMPRESSION:  1. Vague haziness about the gallbladder may reflect mild  cholecystitis, difficult to fully characterize on CT.  2. Mild focal soft tissue inflammation at the mid descending colon,  with irregular surrounding soft tissue density. Though this could  reflect mild diverticulitis or sequelae of prior diverticulitis, an  underlying mass cannot be excluded. Would perform follow-up  colonoscopy after completion of treatment for the acute process, to  evaluate for underlying mass.  3. Diffuse fatty infiltration within the liver. Nonspecific 0.9 cm  hypodensity within the  right hepatic lobe.  4. Nonspecific 2.7 cm left adrenal lesion seen. Adrenal protocol MRI  or CT would be helpful for further evaluation, on an elective  nonemergent basis.  5. Scattered small bilateral renal cysts noted.  6. Mild scattered calcification along the abdominal aorta and its  branches.  7. Degenerative change noted at both hips, worse on the right, with  chronic flattening of the right femoral head and diffuse subcortical  cystic change.      Electronically Signed    By: Garald Balding M.D.    On: 04/30/2014 05:53       Verified By: JEFFREY . CHANG, M.D.,    Assessment/Admission Diagnosis 64 y/o female with DM, tobacco usage and acute calculus cholecystitis Left adrenal lesion diberticulosis   Plan Admit, hydrate, ABX, lap chole in am. reviewed procedure and risks of surgery including bleeding, infection, bile duct injury, retain bile dust stone, bile leak and possible open procedure.  Will need OP colonoscopy and evaluation of adrenal lesion.  all questions addressed.  total time spent 60 minutes   Electronic Signatures: Sherri Rad (MD)  (Signed 22-Dec-15 17:10)  Authored: CHIEF COMPLAINT and HISTORY, ALLERGIES, HOME MEDICATIONS, FAMILY AND SOCIAL HISTORY, REVIEW OF SYSTEMS, PHYSICAL EXAM, LABS, Radiology, ASSESSMENT AND PLAN   Last Updated: 22-Dec-15 17:10 by Sherri Rad (MD)

## 2014-08-31 NOTE — Op Note (Signed)
PATIENT NAME:  Andrea Vega, Andrea Vega MR#:  [id number] DATE OF BIRTH:  06-Dec-1950  DATE OF PROCEDURE:  05/01/2014  PREOPERATIVE DIAGNOSIS: Acute calculous cholecystitis.  POSTOPERATIVE DIAGNOSIS: Acute calculous cholecystitis.  PROCEDURE PERFORMED: Laparoscopic cholecystectomy.  ATTENDING SURGEON: Frank Novelo A. Marina Gravel, MD  ASSISTANT: None.  ANESTHESIA: General endotracheal.  FINDINGS: Acute cholecystitis. There appeared to be steatosis of the liver. The liver was markedly enlarged. The gallbladder was partially intrahepatic.   SPECIMENS: Gallbladder with contents to pathology.  ESTIMATED BLOOD LOSS: 100 mL.  DRAINS: A 19 mm Blake drain to gallbladder fossa and [employer].  DESCRIPTION OF PROCEDURE: With informed consent in supine position, general endotracheal anesthesia, sterile prep and drape of the abdomen, and timeout being observed, a 12 mm blunt Hasson trocar was placed through an infraumbilical transverse oriented skin incision. Stay sutures were passed. Pneumoperitoneum was established. The patient was then positioned in reverse Trendelenburg and airplane right side up. A 5 mm bladeless trocar was placed in the epigastrium, two 5 mm ports in the right subcostal margin. During the course of the operation, an additional 5 mm trocar was placed in the right abdomen. The epigastric port was exchanged to a 12 mm.   The gallbladder was markedly edematous and with gallbladder wall thickening present. The gallbladder was aspirated of thin bile.  Grasped along its fundus, elevated towards the right lobe of the liver, was limited due to the large size of the liver. Attempts at a conventional cholecystectomy with dissection of the hepatoduodenal ligament was unsuccessful due to large liver lobe encroachment. A dome-down cholecystectomy was achieved. The cystic duct was critically identified as well as the cystic artery which was clipped with hemoclips and divided. The cystic duct was divided with the  endoscopic stapler. Specimen was captured in an EndoCatch device and retrieved. The right upper quadrant was copiously irrigated. Two pieces of Surgicel were placed in the gallbladder fossa followed by a 19 mm Blake drain. Ports were then removed under direct visualization. The infraumbilical fascial defect being reapproximated with several interrupted #0 Vicryl sutures in vertical orientation and the existing stay sutures tied to each other. A total of 20 mL of 0.25% plain Marcaine was infiltrated along all skin and fascial incision prior to closure. Skin staples were used to reapproximate skin edges and the wounds were then dressed with sterile dressings and gauze. The patient was then subsequently extubated and taken to the recovery room in stable and satisfactory condition by anesthesia services.   ____________________________ Jeannette How Marina Gravel, MD mab:bm D: 05/01/2014 22:57:40 ET T: 05/02/2014 03:03:09 ET JOB#: [id number]  cc: Elta Guadeloupe A. Marina Gravel, MD, <Dictator> Hortencia Conradi MD ELECTRONICALLY SIGNED 05/03/2014 21:56

## 2014-09-01 LAB — COMPREHENSIVE METABOLIC PANEL
ALK PHOS: 180 U/L — AB (ref 39–117)
ALT: 13 U/L (ref 0–35)
ANION GAP: 12 (ref 5–15)
AST: 13 U/L (ref 0–37)
Albumin: 1.6 g/dL — ABNORMAL LOW (ref 3.5–5.2)
BUN: 31 mg/dL — ABNORMAL HIGH (ref 6–23)
CO2: 25 mmol/L (ref 19–32)
Calcium: 8 mg/dL — ABNORMAL LOW (ref 8.4–10.5)
Chloride: 100 mmol/L (ref 96–112)
Creatinine, Ser: 3.32 mg/dL — ABNORMAL HIGH (ref 0.50–1.10)
GFR calc Af Amer: 16 mL/min — ABNORMAL LOW (ref >90)
GFR calc non Af Amer: 14 mL/min — ABNORMAL LOW (ref >90)
Glucose, Bld: 89 mg/dL (ref 70–99)
POTASSIUM: 3.4 mmol/L — AB (ref 3.5–5.1)
SODIUM: 137 mmol/L (ref 135–145)
Total Bilirubin: 0.5 mg/dL (ref 0.3–1.2)
Total Protein: 5.2 g/dL — ABNORMAL LOW (ref 6.0–8.3)

## 2014-09-01 LAB — CBC WITH DIFFERENTIAL/PLATELET
BASOS ABS: 0 10*3/uL (ref 0.0–0.1)
BASOS PCT: 0 % (ref 0–1)
Eosinophils Absolute: 0.2 10*3/uL (ref 0.0–0.7)
Eosinophils Relative: 1 % (ref 0–5)
HCT: 24.4 % — ABNORMAL LOW (ref 36.0–46.0)
Hemoglobin: 7.2 g/dL — ABNORMAL LOW (ref 12.0–15.0)
LYMPHS ABS: 1.3 10*3/uL (ref 0.7–4.0)
Lymphocytes Relative: 9 % — ABNORMAL LOW (ref 12–46)
MCH: 25.9 pg — ABNORMAL LOW (ref 26.0–34.0)
MCHC: 29.5 g/dL — AB (ref 30.0–36.0)
MCV: 87.8 fL (ref 78.0–100.0)
Monocytes Absolute: 1.8 10*3/uL — ABNORMAL HIGH (ref 0.1–1.0)
Monocytes Relative: 12 % (ref 3–12)
Neutro Abs: 11.2 10*3/uL — ABNORMAL HIGH (ref 1.7–7.7)
Neutrophils Relative %: 77 % (ref 43–77)
Platelets: 280 10*3/uL (ref 150–400)
RBC: 2.78 MIL/uL — AB (ref 3.87–5.11)
RDW: 23.4 % — ABNORMAL HIGH (ref 11.5–15.5)
WBC: 14.5 10*3/uL — ABNORMAL HIGH (ref 4.0–10.5)

## 2014-09-01 LAB — ABO/RH: ABO/RH(D): A POS

## 2014-09-01 LAB — CBC
HCT: 25.2 % — ABNORMAL LOW (ref 36.0–46.0)
Hemoglobin: 7.5 g/dL — ABNORMAL LOW (ref 12.0–15.0)
MCH: 26.2 pg (ref 26.0–34.0)
MCHC: 29.8 g/dL — ABNORMAL LOW (ref 30.0–36.0)
MCV: 88.1 fL (ref 78.0–100.0)
PLATELETS: 289 10*3/uL (ref 150–400)
RBC: 2.86 MIL/uL — ABNORMAL LOW (ref 3.87–5.11)
RDW: 23.3 % — AB (ref 11.5–15.5)
WBC: 14.4 10*3/uL — AB (ref 4.0–10.5)

## 2014-09-01 LAB — PROTIME-INR
INR: 4.72 — ABNORMAL HIGH (ref 0.00–1.49)
Prothrombin Time: 44.7 seconds — ABNORMAL HIGH (ref 11.6–15.2)

## 2014-09-01 LAB — MAGNESIUM: Magnesium: 1.5 mg/dL (ref 1.5–2.5)

## 2014-09-01 LAB — PREPARE RBC (CROSSMATCH)

## 2014-09-02 LAB — CBC WITH DIFFERENTIAL/PLATELET
BASOS PCT: 0 % (ref 0–1)
Basophils Absolute: 0 10*3/uL (ref 0.0–0.1)
EOS ABS: 0.1 10*3/uL (ref 0.0–0.7)
EOS PCT: 1 % (ref 0–5)
HEMATOCRIT: 25.5 % — AB (ref 36.0–46.0)
Hemoglobin: 7.8 g/dL — ABNORMAL LOW (ref 12.0–15.0)
LYMPHS ABS: 1.1 10*3/uL (ref 0.7–4.0)
Lymphocytes Relative: 8 % — ABNORMAL LOW (ref 12–46)
MCH: 26.3 pg (ref 26.0–34.0)
MCHC: 30.6 g/dL (ref 30.0–36.0)
MCV: 85.9 fL (ref 78.0–100.0)
MONO ABS: 1.7 10*3/uL — AB (ref 0.1–1.0)
Monocytes Relative: 12 % (ref 3–12)
NEUTROS PCT: 79 % — AB (ref 43–77)
Neutro Abs: 11.1 10*3/uL — ABNORMAL HIGH (ref 1.7–7.7)
PLATELETS: 270 10*3/uL (ref 150–400)
RBC: 2.97 MIL/uL — ABNORMAL LOW (ref 3.87–5.11)
RDW: 21.8 % — ABNORMAL HIGH (ref 11.5–15.5)
WBC: 14 10*3/uL — ABNORMAL HIGH (ref 4.0–10.5)

## 2014-09-02 LAB — URINALYSIS, ROUTINE W REFLEX MICROSCOPIC
Bilirubin Urine: NEGATIVE
Glucose, UA: NEGATIVE mg/dL
Hgb urine dipstick: NEGATIVE
KETONES UR: NEGATIVE mg/dL
NITRITE: NEGATIVE
PH: 6 (ref 5.0–8.0)
Protein, ur: 30 mg/dL — AB
SPECIFIC GRAVITY, URINE: 1.008 (ref 1.005–1.030)
Urobilinogen, UA: 0.2 mg/dL (ref 0.0–1.0)

## 2014-09-02 LAB — RENAL FUNCTION PANEL
ANION GAP: 13 (ref 5–15)
Albumin: 1.7 g/dL — ABNORMAL LOW (ref 3.5–5.2)
BUN: 39 mg/dL — ABNORMAL HIGH (ref 6–23)
CO2: 26 mmol/L (ref 19–32)
CREATININE: 4.17 mg/dL — AB (ref 0.50–1.10)
Calcium: 8.2 mg/dL — ABNORMAL LOW (ref 8.4–10.5)
Chloride: 96 mmol/L (ref 96–112)
GFR calc Af Amer: 12 mL/min — ABNORMAL LOW (ref >90)
GFR calc non Af Amer: 11 mL/min — ABNORMAL LOW (ref >90)
Glucose, Bld: 82 mg/dL (ref 70–99)
PHOSPHORUS: 4 mg/dL (ref 2.3–4.6)
POTASSIUM: 3.1 mmol/L — AB (ref 3.5–5.1)
Sodium: 135 mmol/L (ref 135–145)

## 2014-09-02 LAB — SURGICAL PATHOLOGY

## 2014-09-02 LAB — URINE MICROSCOPIC-ADD ON

## 2014-09-02 LAB — MAGNESIUM: Magnesium: 1.8 mg/dL (ref 1.5–2.5)

## 2014-09-02 LAB — PROTIME-INR
INR: 4.54 — ABNORMAL HIGH (ref 0.00–1.49)
PROTHROMBIN TIME: 43.4 s — AB (ref 11.6–15.2)

## 2014-09-03 LAB — URINE CULTURE

## 2014-09-03 LAB — BASIC METABOLIC PANEL
ANION GAP: 10 (ref 5–15)
BUN: 18 mg/dL (ref 6–23)
CALCIUM: 7.8 mg/dL — AB (ref 8.4–10.5)
CO2: 26 mmol/L (ref 19–32)
CREATININE: 2.73 mg/dL — AB (ref 0.50–1.10)
Chloride: 103 mmol/L (ref 96–112)
GFR calc Af Amer: 20 mL/min — ABNORMAL LOW (ref >90)
GFR calc non Af Amer: 18 mL/min — ABNORMAL LOW (ref >90)
Glucose, Bld: 79 mg/dL (ref 70–99)
Potassium: 3.5 mmol/L (ref 3.5–5.1)
Sodium: 139 mmol/L (ref 135–145)

## 2014-09-03 LAB — PROTIME-INR
INR: 4.66 — ABNORMAL HIGH (ref 0.00–1.49)
Prothrombin Time: 44.2 seconds — ABNORMAL HIGH (ref 11.6–15.2)

## 2014-09-04 ENCOUNTER — Other Ambulatory Visit (HOSPITAL_COMMUNITY): Payer: Self-pay

## 2014-09-04 LAB — RENAL FUNCTION PANEL
Albumin: 1.6 g/dL — ABNORMAL LOW (ref 3.5–5.2)
Anion gap: 11 (ref 5–15)
BUN: 24 mg/dL — AB (ref 6–23)
CO2: 23 mmol/L (ref 19–32)
CREATININE: 3.22 mg/dL — AB (ref 0.50–1.10)
Calcium: 7.8 mg/dL — ABNORMAL LOW (ref 8.4–10.5)
Chloride: 100 mmol/L (ref 96–112)
GFR calc Af Amer: 17 mL/min — ABNORMAL LOW (ref >90)
GFR calc non Af Amer: 14 mL/min — ABNORMAL LOW (ref >90)
Glucose, Bld: 77 mg/dL (ref 70–99)
PHOSPHORUS: 2.8 mg/dL (ref 2.3–4.6)
Potassium: 3.4 mmol/L — ABNORMAL LOW (ref 3.5–5.1)
Sodium: 134 mmol/L — ABNORMAL LOW (ref 135–145)

## 2014-09-04 LAB — CBC
HEMATOCRIT: 25.2 % — AB (ref 36.0–46.0)
Hemoglobin: 7.6 g/dL — ABNORMAL LOW (ref 12.0–15.0)
MCH: 26.2 pg (ref 26.0–34.0)
MCHC: 30.2 g/dL (ref 30.0–36.0)
MCV: 86.9 fL (ref 78.0–100.0)
PLATELETS: 316 10*3/uL (ref 150–400)
RBC: 2.9 MIL/uL — ABNORMAL LOW (ref 3.87–5.11)
RDW: 21 % — ABNORMAL HIGH (ref 11.5–15.5)
WBC: 14.7 10*3/uL — AB (ref 4.0–10.5)

## 2014-09-04 LAB — PROTIME-INR
INR: 2.98 — ABNORMAL HIGH (ref 0.00–1.49)
Prothrombin Time: 31.2 seconds — ABNORMAL HIGH (ref 11.6–15.2)

## 2014-09-05 LAB — PROTIME-INR
INR: 2.66 — AB (ref 0.00–1.49)
Prothrombin Time: 28.5 seconds — ABNORMAL HIGH (ref 11.6–15.2)

## 2014-09-05 LAB — TYPE AND SCREEN
ABO/RH(D): A POS
ANTIBODY SCREEN: NEGATIVE
UNIT DIVISION: 0
UNIT DIVISION: 0

## 2014-09-06 LAB — RENAL FUNCTION PANEL
ALBUMIN: 1.5 g/dL — AB (ref 3.5–5.2)
Anion gap: 10 (ref 5–15)
BUN: 17 mg/dL (ref 6–23)
CALCIUM: 7.8 mg/dL — AB (ref 8.4–10.5)
CHLORIDE: 105 mmol/L (ref 96–112)
CO2: 25 mmol/L (ref 19–32)
Creatinine, Ser: 3.08 mg/dL — ABNORMAL HIGH (ref 0.50–1.10)
GFR calc Af Amer: 18 mL/min — ABNORMAL LOW (ref >90)
GFR, EST NON AFRICAN AMERICAN: 15 mL/min — AB (ref >90)
Glucose, Bld: 85 mg/dL (ref 70–99)
Phosphorus: 3.4 mg/dL (ref 2.3–4.6)
Potassium: 3.7 mmol/L (ref 3.5–5.1)
SODIUM: 140 mmol/L (ref 135–145)

## 2014-09-06 LAB — CBC
HEMATOCRIT: 24.7 % — AB (ref 36.0–46.0)
Hemoglobin: 7.3 g/dL — ABNORMAL LOW (ref 12.0–15.0)
MCH: 26.3 pg (ref 26.0–34.0)
MCHC: 29.6 g/dL — ABNORMAL LOW (ref 30.0–36.0)
MCV: 88.8 fL (ref 78.0–100.0)
Platelets: 310 10*3/uL (ref 150–400)
RBC: 2.78 MIL/uL — ABNORMAL LOW (ref 3.87–5.11)
RDW: 20.1 % — ABNORMAL HIGH (ref 11.5–15.5)
WBC: 15.9 10*3/uL — ABNORMAL HIGH (ref 4.0–10.5)

## 2014-09-06 LAB — PROTIME-INR
INR: 2.84 — ABNORMAL HIGH (ref 0.00–1.49)
Prothrombin Time: 30 seconds — ABNORMAL HIGH (ref 11.6–15.2)

## 2014-09-07 LAB — PROTIME-INR
INR: 2.76 — AB (ref 0.00–1.49)
PROTHROMBIN TIME: 29.4 s — AB (ref 11.6–15.2)

## 2014-09-08 ENCOUNTER — Other Ambulatory Visit (HOSPITAL_COMMUNITY): Payer: Self-pay

## 2014-09-08 LAB — PROTIME-INR
INR: 2.55 — ABNORMAL HIGH (ref 0.00–1.49)
Prothrombin Time: 27.6 seconds — ABNORMAL HIGH (ref 11.6–15.2)

## 2014-09-08 NOTE — Consult Note (Signed)
General Aspect Reason for Consultation:  Atrial flutter   Present Illness The patient has no past cardiac history.  She was admitted with LLQ pain and was found to have bowel obstruction and abscess.  She is now status post ex lap, left hemicolectomy, appendectomy, colostomy and abscess drainage.  Post op she was intubated and has been hypotensive with presumed septic shock.  She was extubated this evening.  She has subsequently developed atrial flutter with rapid rate.  She has not repsonded to IV cardizem.  She actually is not having any acute SOB.  She denies chest pain.  She is not noticing the palpitations.  She is having some incisional pain.  She has not had any recent cardiac symptoms.  She has no chest pain, neck or arm pain.  She has had no SOB, PND or orthopnea   PMH:  DM, obesity, GERD, HTN  PSH:  Cholecystectomy  SOCIAL:  She is disabled secondary to hip pain.  Positive tobacco abuse  FH:  Mother with colon CA and atrial fib   Physical Exam:  GEN critically ill appearing   HEENT Face mask.  Oral mucosa not examined   NECK Thick neck.  No obvious JVD   RESP normal resp effort  decreased breath sounds   CARD Irregular rate and rhythm  No murmur  distant heart sounds   ABD distended  hypoactive BS  colostomy   LYMPH negative neck   EXTR positive edema, mild diffuse edema   SKIN normal to palpation   NEURO cranial nerves intact, motor/sensory function intact   PSYCH alert, good insight   Review of Systems:  Review of Systems: All other systems were reviewed and found to be negative   Home Medications: Medication Instructions Status  metroNIDAZOLE 500 mg oral tablet 1 tab(s) orally 4 times a day Active  ciprofloxacin 500 mg oral tablet 1 tab(s) orally 2 times a day Active  potassium chloride 20 mEq oral tablet, extended release 1 tab(s) orally once a day (in the morning) Active  acetaminophen-HYDROcodone 325 mg-5 mg oral tablet 1 tab(s) orally 2 times a day,  As Needed - for Pain Active  hydrochlorothiazide 25 mg oral tablet 1 tab(s) orally once a day (in the morning) Active  sertraline 50 mg oral tablet 1 tab(s) orally once a day (at bedtime) Active  metFORMIN 500 mg oral tablet, extended release 1 tab(s) orally once a day (in the morning) Active  meloxicam 15 mg oral tablet 1 tab(s) orally once a day (in the morning) Active  omeprazole 20 mg oral delayed release capsule 1 cap(s) orally once a day (in the morning) Active   Lab Results: LabObservation:  04-Mar-16 19:01   OBSERVATION Reason for Test  Hepatic:  04-Mar-16 04:21   Bilirubin, Total 0.8  Alkaline Phosphatase 73  SGPT (ALT)  9  SGOT (AST)  14  Total Protein, Serum  4.7  Albumin, Serum  1.3  Lab:  04-Mar-16 05:10   pH (ABG)  7.300 (7.350-7.450 NOTE: New Reference Range 12/01/13)  PCO2 43 (32-48 NOTE: New Reference Range 12/18/13)  PO2  109 (83-108 NOTE: New Reference Range 12/01/13)  FiO2 40  Base Excess  -5 (-3-3 NOTE: New Reference Range 12/18/13)  HCO3 21.2 (21.0-28.0 NOTE: New Reference Range 12/01/13)  O2 Saturation 99.0  O2 Device vent  Specimen Site (ABG) RT RADIAL  Specimen Type (ABG) ARTERIAL  Patient Temp (ABG) 37.0  Mode ASSIST CONTROL  Vt 500  PEEP 5.0  Mechanical Rate 15  18:20   pH (ABG)  7.330 (7.350-7.450 NOTE: New Reference Range 12/01/13)  PCO2 42 (32-48 NOTE: New Reference Range 12/18/13)  PO2  58 (83-108 NOTE: New Reference Range 12/01/13)  FiO2 55  Base Excess  -4 (-3-3 NOTE: New Reference Range 12/18/13)  HCO3 22.1 (21.0-28.0 NOTE: New Reference Range 12/01/13)  O2 Saturation 91.3  O2 Device mask  Specimen Site (ABG) RT BRACHIAL  Specimen Type (ABG) ARTERIAL  Patient Temp (ABG) 37.0 (Result(s) reported on 12 Jul 2014 at [facility].)  Routine Chem:  04-Mar-16 04:21   Result Comment LABS - This specimen was collected through an   - indwelling catheter or arterial line.  - A minimum of 38ms of blood was wasted prior    - to  collecting the sample.  Interpret  - results with caution.  Result(s) reported on 12 Jul 2014 at 04:54AM.  Glucose, Serum  207  BUN 13  Creatinine (comp)  1.31  Sodium, Serum  135  Potassium, Serum 3.9  Chloride, Serum 101  CO2, Serum 24  Calcium (Total), Serum  7.4  Osmolality (calc) 276  eGFR (African American)  53  eGFR (Non-African American)  44 (eGFR values <656mmin/1.73 m2 may be an indication of chronic kidney disease (CKD). Calculated eGFR, using the MRDR Study equation, is useful in  patients with stable renal function. The eGFR calculation will not be reliable in acutely ill patients when serum creatinine is changing rapidly. It is not useful in patients on dialysis. The eGFR calculation may not be applicable to patients at the low and high extremes of body sizes, pregnant women, and vegetarians.)  Anion Gap 10    05:10   Result Comment - Mode PRVC  Result(s) reported on 12 Jul 2014 at 05:17AM.  Cardiac:  04-Mar-16 18:03   Troponin I < 0.02 (0.00-0.05 0.05 ng/mL or less: NEGATIVE  Repeat testing in 3-6 hrs  if clinically indicated. >0.05 ng/mL: POTENTIAL  MYOCARDIAL INJURY. Repeat  testing in 3-6 hrs if  clinically indicated. NOTE: An increase or decrease  of 30% or more on serial  testing suggests a  clinically important change)  Routine Hem:  04-Mar-16 04:21   Hemoglobin (CBC)  10.8  WBC (CBC)  24.2  RBC (CBC) 4.57  Hematocrit (CBC) 35.4  Platelet Count (CBC)  556  MCV  77  MCH  23.6  MCHC  30.5  RDW  18.7  Neutrophil % 92.2  Lymphocyte % 5.3  Monocyte % 2.4  Eosinophil % 0.0  Basophil % 0.1  Neutrophil #  22.3  Lymphocyte # 1.3  Monocyte # 0.6  Eosinophil # 0.0  Basophil # 0.0    18:03   Hemoglobin (CBC)  9.1 (Result(s) reported on 12 Jul 2014 at 06:27PM.)   EKG:  Interpretation Atrial flutter with 2:1 conduction.  Rate 164.  Low voltage limb leads.  No acute ST T wave changes.   Radiology Results: XRay:    04-Mar-16 06:31, Chest  Portable Single View  Chest Portable Single View   REASON FOR EXAM:    vent  COMMENTS:       PROCEDURE: DXR - DXR PORTABLE CHEST SINGLE VIEW  - Jul 12 2014  6:31AM     CLINICAL DATA:  Hypoxia/septic shock    EXAM:  PORTABLE CHEST - 1 VIEW    COMPARISON:  July 11, 2014    FINDINGS:  Endotracheal tube tip is 4.3 cm above the carina. Central catheter  tip is in the superior vena cava. No  pneumothorax.  in the medial right base. Lungs elsewhere are clear. The heart is  upper normal in size with pulmonary vascularity within normal  limits. No adenopathy. No bone lesions.     IMPRESSION:  Tube and catheter positions as described without pneumothorax.  Atelectasis medial right base, stable. Lungs otherwise clear.      Electronically Signed    By: Lowella Grip III M.D.    On: 07/12/2014 07:54         Verified By: Leafy Kindle. WOODRUFF, M.D.,    Penicillin: Hives  Vital Signs/Nurse's Notes: **Vital Signs.:   04-Mar-16 15:00  Pulse Pulse 101  Respirations Respirations 14  Systolic BP Systolic BP 868  Diastolic BP (mmHg) Diastolic BP (mmHg) 75  Mean BP 85  Pulse Ox % Pulse Ox % 94  Pulse Ox Activity Level  At rest  Oxygen Delivery 4L    Impression Atrial flutter/fib:  I will hold her beta blocker and stop the IV Cardizem.  I will start amiodarone IV without a bolus.  I suspect that she will convert to sinus as there are some sinus beats at times.  Echo is pending.  Otherwise continue supportive care.   Electronic Signatures: Minus Breeding (MD)  (Signed 04-Mar-16 19:59)  Authored: General Aspect/Present Illness, History and Physical Exam, Review of System, Home Medications, Labs, EKG , Radiology, Allergies, Vital Signs/Nurse's Notes, Impression/Plan   Last Updated: 04-Mar-16 19:59 by Minus Breeding (MD)

## 2014-09-08 NOTE — Consult Note (Signed)
Referring Physician:  Florene Glen   Primary Care Physician:  [facility], [address], [place], [place] [address], [phone]  Reason for Consult: Admit Date: 16-Jul-2014  Chief Complaint: confusion  Reason for Consult: confusion   History of Present Illness: History of Present Illness:   seen at request of Dr. Margaretmary Eddy for confusion;  64 yo RHD F presents to [facility] secondary to abdominal pain.  Pt was discovered to have an abdominal mass which was removed.  Yesterday pt started to develop some confusion.  There was concern for brain metastasis so neurology was called.  Pt was also noted to have worsening renal failure and some signs of sepsis.  ROS:  Review of Systems   unobtainable secondary to Bipap  Past Medical/Surgical Hx:  Multi-drug Resistant Organism (MDRO): Positive culture for MRSA.  Diabetes:   degeneration of joint: right hip  sciatica: right hip  Gall Bladder: removed at [facility] Bladder: removed  Past Medical/ Surgical Hx:  Past Medical History reviewed by me as above   Past Surgical History reviewed by me as above   Home Medications: Medication Instructions Last Modified Date/Time  metroNIDAZOLE 500 mg oral tablet 1 tab(s) orally 4 times a day 02-Mar-16 16:23  ciprofloxacin 500 mg oral tablet 1 tab(s) orally 2 times a day 02-Mar-16 16:23  potassium chloride 20 mEq oral tablet, extended release 1 tab(s) orally once a day (in the morning) 02-Mar-16 16:23  acetaminophen-HYDROcodone 325 mg-5 mg oral tablet 1 tab(s) orally 2 times a day, As Needed - for Pain 02-Mar-16 16:23  hydrochlorothiazide 25 mg oral tablet 1 tab(s) orally once a day (in the morning) 02-Mar-16 16:23  sertraline 50 mg oral tablet 1 tab(s) orally once a day (at bedtime) 02-Mar-16 16:23  metFORMIN 500 mg oral tablet, extended release 1 tab(s) orally once a day (in the morning) 02-Mar-16 16:23  meloxicam 15 mg oral tablet 1 tab(s) orally once a day (in the  morning) 02-Mar-16 16:23  omeprazole 20 mg oral delayed release capsule 1 cap(s) orally once a day (in the morning) 02-Mar-16 16:23   Allergies:  Ativan: Resp. Distress  Morphine: N/V/Diarrhea, Resp. Distress  Penicillin: Hives  Allergies:  Allergies reviewed as above   Social/Family History: Employment Status: currently employed  Lives With: spouse  Living Arrangements: apartment  Social History: rare EtOH, no tob, no illicits  Family History: no seizures, no stroke   Vital Signs: **Vital Signs.:   15-Mar-16 19:32  Temperature Temperature (F) 97.7  Celsius 36.5  Temperature Source axillary  Pulse Pulse 64  Respirations Respirations 13  Systolic BP Systolic BP 976  Diastolic BP (mmHg) Diastolic BP (mmHg) 55  Mean BP 70  Pulse Ox % Pulse Ox % 100  Pulse Ox Activity Level  At rest  Oxygen Delivery Non-invasive ventilation (CPAP/BIPAP)   Physical Exam: General: overweight, mild distress with BiPAP  HEENT: normocephalic, sclera nonicteric, oropharynx clear  Neck: supple, no JVD, no bruits  Chest: coarse BS, tachypneic  Cardiac: tachycardic, no murmur, 2+ pulses  Extremities: mild edema, no C/C; FROM   Neurologic Exam: Mental Status: BiPAP but alert, oriented x 2 not time, follows simple commands; limited verbal due to BiPAP  Cranial Nerves: PERRLA, EOMI,  face symmetric, tongue midline, shoulder shrug equal,  Motor Exam: 4/5 B, no tone  Deep Tendon Reflexes: 1+/4 B, downgoing plantars  Sensory Exam: grimaces to pain in all four ext   Lab Results: LabObservation:  04-Mar-16 18:30  OBSERVATION Reason for Test  Hepatic:  15-Mar-16 03:56   Bilirubin, Total 0.6 (0.3-1.2 NOTE: New Reference Range  07/02/14)  Alkaline Phosphatase  157 (38-126 NOTE: New Reference Range  07/02/14)  SGPT (ALT) 17 (14-54 NOTE: New Reference Range  07/02/14)  SGOT (AST) 16 (15-41 NOTE: New Reference Range  07/02/14)  Total Protein, Serum  6.4 (6.5-8.1 NOTE: New Reference Range   07/02/14)  Albumin, Serum  1.5 (3.5-5.0 NOTE: New reference range  07/02/14)  Pathology:  03-Mar-16 00:00   Pathology Report **** THIS IS AN AMENDED REPORT **** CASE: [id number] PATIENT: Andrea Vega Surgical Pathology Report ********** Amendment **********  Reason for Amendment #1: Other   SPECIMEN SUBMITTED: A. Colon, splenic flexure and omentum B. Appendix C. Colon, left   CLINICAL HISTORY: exp. lap. drainage of retroperitoneal abscess, extended left colectomy, appendectomy, colostomy   PRE-OPERATIVE DIAGNOSIS: perforated colon   POST-OPERATIVE DIAGNOSIS: same      DIAGNOSIS:   A. COLON, SPLENIC FLEXURE AND OMENTUM; EXTENDED LEFT COLECTOMY AND OMENTECTOMY: - INVASIVE ADENOCARCINOMA, MODERATELY DIFFERENTIATED, WITH TRANMURAL PENETRATION THROUGH THE SEROSA, AT LEAST. - SEPARATE TUBULOVILLOUS ADENOMA, 1.8 CM, NEGATIVE FOR HIGH-GRADE DYSPLASIA AND MALIGNANCY. - ONE (PERICOLIC) OF THIRTEEN LYMPH NODES INVOLVED BY METASTATIC CARCINOMA (1/13). - MATURE ADIPOSE TISSUE CONSISTENT WITH OMENTUM, NO TUMOR SEEN. - THE RADIAL MARGIN IS INVOLVED. - SEE CANCER CASE SUMMARY BELOW.  B. APPENDIX; APPENDECTOMY: - WELL-DIFFERENTIATED NEUROENDOCRINE TUMOR, GRADE 1. - SEE CANCER CASE SUMMARY BELOW.  C. COLON, LEFT; COLECTOMY: - COLONIC MUCOSA WITHOUT PATHOLOGIC CHANGES. COLON AND RECTUM: Resection, Including Transanal Disk Excision of Rectal Neoplasms Specimens InvolvedA: Colon, splenic flexure and omentum  Colon and Rectum, Resection Cancer Case Summary SPECIMEN Specimen: Other (specify) Splenic flexure Procedure:     Other (specify) Extended left colectomy Primary Tumor Site: Splenic flexure Additional Sites Involved by Tumor:     None identified Macroscopic Tumor Perforation:     Present TUMOR Histologic Type:    Adenocarcinoma Histologic Grade:   Low-grade (well differentiated to moderately differentiated) EXTENT Tumor Size:    Greatest dimension  (cm) 5.4cm Microscopic Tumor Extension:  Tumor penetrates to the surface of the visceral peritoneum (serosa) MARGINS Proximal Margin:    Uninvolved by invasive carcinoma - No adenoma or intraepithelial neoplasia / dysplasia identified Distal Margin: Uninvolved by invasive carcinoma - No adenoma or intraepithelial neoplasia / dysplasia identified Circumferential (Radial) or Mesenteric Margin: Involved by invasive carcinoma (tumor present 0-1 mm from margin) Deep Margin:   N/A ACCESSORY FINDINGS Lymph-Vascular Invasion: Present Perineural Invasion:     Present Tumor Deposits:     Not identified N/A STAGE  (pTNM) TNM Descriptors:    N/A Primary Tumor (pT): pT4a: Tumor penetrates the visceral peritoneum Regional Lymph Nodes (pN) pN1a: Metastasis in 1 regional lymph node Number of Lymph Nodes Examined:    Specify 13 Number of Lymph Nodes Involved:    Specify 1 Distant Metastasis (pM): Not applicable   APPENDIX, Neuroendocrine Tumor (Carcinoid Tumors): Excision (Appendectomy) or Resection Specimens InvolvedB: Appendix  Appendix, Neuroendocrine Tumor Cancer Case Summary SPECIMEN Specimen: Appendix Procedure:     Appendectomy Specimen Integrity: Intact Tumor Site:    Appendix, not otherwise specified TUMOR Histologic Type and Grade#:   Well-differentiated neuroendocrine tumor; G1: Low grade (carcinoid) EXTENT Tumor Size:    Greatest dimension (cm) 0.2cm Microscopic Tumor Extension:  Tumor invades submucosa MARGINS Proximal Margin:    Uninvolved by tumor Mesenteric (Mesoappendiceal) Margin:    Uninvolved by tumor Distance of Tumor From Closest Mesenteric Margin: Specify (mm) 83m ACCESSORY FINDINGS  Mitotic Rate:  Specify mitoses per 10 high-power fields (HPF) 0 Lymph-Vascular Invasion: Not identified N/A STAGE (pTNM) TNM Descriptors:    N/A Primary Tumor (pT): pT1a: Tumor 1 cm or less in greatest dimension Regional Lymph Nodes Cannot be assessed N/A No nodes  submitted or found Distant Metastasis: Not applicable N/A      GROSS DESCRIPTION: A. Labeled: Splenic flexure and omentum  Parts of bowel received: Portion of large bowel  Measurement: 36 cm in length and ranges from 3.9-9.5 cm in circumference  Orientation: Area around defect inked blue  Tumor location: Toward one end of the bowel  Gross appearance of tumor: Circumferential ulcerated perforated mass ranging from right red to dark green brown. Tumor dimensions: 5.4 x 5.5 x 1.2 cm  Bowel circumference at tumor site: 5 cm  Circumferential growth: 100%  Gross depth of invasion: Through the wall into the pericolonic fat and is grossly visualized on the serosa.  Gross evidence of perforation through visceral peritoneum: Specimen is grossly perforated  Luminal obstruction: At least 75%  Gross Distance of tumor to margins:      Proximal margin: 5 cm      Distal margin: 26 cm      Mesenteric margin: 3.5 cm  Other remarkable findings: Located 2.8 cm away from the main mass is a second pink red pedunculated polypoid structure that measures 1.8 x 1.5 x 0.6 cm, which is attached to an elongated piece of colonic mucosa (stalk) that measures 3 cm in length. Separately submitted is a piece of yellow lobulated omental fat that measures 26 x 13 x 2.5 cm, which on sectioning is unremarkable.  Lymph nodes: Upon dissection there are 12 pink red to tan lymph node candidates noted ranging from 0.2 cm up to 1 x 0.7 cm.  Block Summary: 1- margin proximal to tumor 2- margin distal to tumor 3- radial mesenteric fat margin 4-6- sections of mass including inked serosa at defect and underlying pericolonic fat 7-8- pedunculated polypoid mass 9-5 lymph nodes candidates 10- 3 lymph nodes candidates 11-12 onebisected lymph node candidate per cassette 13-14- one trisected lymph node candidate per cassette  B. The specimen is received in a formalin-filled container labeled with the  patient's name and appendix.  Measurement: 7.7 x 0.5-0.8 cm Externalsurface: Pale-tan and smooth Perforated: No Fecalith: No Other findings:  The proximal margin is inked blue. The distal lumen is pinpoint.  Block summary: 1 -representative sections 2-3- Rest of appendix submitted on 07/16/14  C. Labeled: Left colon  Parts of bowel received: Portion of large bowel  Measurement: 10 cm in length by 4.2 cm in circumference.  Orientation: None  Other remarkable findings: Serosal surface is pale tan with a small amount of adhesions noted on the pericolonic fat. On opening the lumen contains fecal matter, and the mucosal folds are unremarkable. No masses, areas of perforation or diverticuli are grossly noted. Sectioning through the pericolonic fat does not reveal any lymph node candidates.  Lymph nodes: None identified  Block Summary: 1- margins of resection 2- adhesions of pericolonic fat 3- random sections of bowel       Amendment #1 performed by Delorse Lek, MD.  Electronically signed 07/18/2014 10:24:07AM    Final Diagnosis performed by Delorse Lek, MD.  Electronically signed 07/18/2014 8:45:01AM   The electronic signature indicates that the named Attending Pathologist has evaluated the specimen  Technical component performed at [facility], [address], [place], [place] [address] Lab: [phone] Dir: Darrick Penna. Evette Doffing, MD  Professional component  performed at [facility], [facility], [place], [place], [place] [address] Lab: [phone] Dir: Dellia Nims. Reuel Derby, MD  Technical component performed at [facility], [address] [place], [place] [address] Lab: [phone] Dir: Darrick Penna. Evette Doffing, MD  Professional component performed at [facility], [facility], [place], [place], [place] [address] Lab: [phone] Dir: Dellia Nims. Rubinas, MD   TDMs:  14-Mar-16 23:25   Vancomycin, Trough LAB  27 (10-20 NOTE: New Reference  Range  07/02/14)  15-Mar-16 11:58   Vancomycin, Random 22 (Result(s) reported on 23 Jul 2014 at 12:35PM.)  LabUnknown:  03-Mar-16 14:15   Ind. Clindamycin Resistance POSITIVE  Routine BB:  03-Mar-16 07:59   Crossmatch Unit 1 Cancelled  Crossmatch Unit 2 Transfused    19:59   ABO Group + Rh Type A Positive  Antibody Screen NEGATIVE (Result(s) reported on 11 Jul 2014 at 09:15PM.)  Routine Micro:  03-Mar-16 14:15   Gram Stain 1 RARE WHITE BLOOD CELLS  Gram Stain 2 NO ORGANISMS SEEN  Result(s) reported on 15 Jul 2014 at 10:48AM.  Organism Name Fusobacterium nucleatum  Organism Quantity MODERATE GROWTH  Beta-lactamase NEGATIVE  Clindamycin Sensitivity R  Oxacillin Sensitivity R  Ciprofloxacin Sensitivity R  Gentamicin Sensitivity S  Erythromycin Sensitivity R  Vancomycin Sensitivity S  Levofloxacin Sensitivity R  Linezolid Sensitivity S  Tigecycline Sensitivity S  Trimethoprim/Sulfamethoxazole Sensitivty S  Cefoxitin Scrn. POSITIVE  Organism 1 MODERATE GROWTH METHICILLIN RESISTANT STAPH.AUREUS  Organism 2 MODERATE GROWTH BACTEROIDES THETAIOTAOMICRON  Organism 3 MODERATE GROWTH Fusobacterium nucleatum  Culture Comment . -  Culture Comment    . -  Gram Stain 3 FEW GRAM VARIABLE RODS  Gram Stain 4 RARE GRAM POSITIVE ROD FEW GRAM NEGATIVE ROD  12-Mar-16 09:05   Specimen Source #2 right hand  15-Mar-16 14:00   Micro Text Report BLOOD CULTURE   COMMENT                   NO GROWTH IN 8-12 HOURS   ANTIBIOTIC                       Culture Comment NO GROWTH IN 8-12 HOURS  Result(s) reported on 23 Jul 2014 at 10:00PM.  General Ref:  06-Mar-16 05:28   Prealbumin ========== TEST NAME ==========  ========= RESULTS =========  = REFERENCE RANGE =  TPN PANEL  Prealbumin Prealbumin                      [L  4 mg/dL              ]              [address]               [facility]            No: [phone]           [address], [place], [place] [address]           Lindon Romp, MD         [phone]   Result(s) reported on 15 Jul 2014 at 04:54PM.  Routine Chem:  02-Mar-16 14:51   Lipase 133 (Result(s) reported on 10 Jul 2014 at 03:29PM.)  06-Mar-16 05:28   Cholesterol, Serum 88 (Result(s) reported on 14 Jul 2014 at 06:35AM.)  Triglycerides, Serum  209 (Result(s) reported on 14 Jul 2014 at 06:35AM.)  08-Mar-16 03:53   Osmolality (calc) 288  15-Mar-16 03:56   Phosphorus, Serum  6.7 (2.5-4.6 NOTE: New Reference Range  07/02/14)  Ammonia, Plasma 23 (9-35 NOTE: New Reference Range  07/02/14)  Glucose, Serum  159 (65-99 NOTE: New Reference Range  07/02/14)  BUN  50 (6-20 NOTE: New Reference Range  07/02/14)  Creatinine (comp)  2.90 (0.44-1.00 NOTE: New Reference Range  07/02/14)  Sodium, Serum 139 (135-145 NOTE: New Reference Range  07/02/14)  Potassium, Serum 3.7 (3.5-5.1 NOTE: New Reference Range  07/02/14)  Chloride, Serum 103 (101-111 NOTE: New Reference Range  07/02/14)  CO2, Serum 24 (22-32 NOTE: New Reference Range  07/02/14)  Calcium (Total), Serum  7.5 (8.9-10.3 NOTE: New Reference Range  07/02/14)  eGFR (African American)  19  eGFR (Non-African American)  17 (eGFR values <68m/min/1.73 m2 may be an indication of chronic kidney disease (CKD). Calculated eGFR is useful in patients with stable renal function. The eGFR calculation will not be reliable in acutely ill patients when serum creatinine is changing rapidly. It is not useful in patients on dialysis. The eGFR calculation may not be applicable to patients at the low and high extremes of body sizes, pregnant women, and vegetarians.)  Anion Gap 12  Magnesium, Serum 1.8 (1.7-2.4 THERAPEUTIC RANGE: 4-7 mg/dL TOXIC: > 10 mg/dL  ----------------------- NOTE: New Reference Range  07/02/14)    13:45   Result Comment CBC - SMEAR SCANNED  Result(s) reported on 23 Jul 2014 at 03:26PM.    14:19   Lactic Acid  Venous 1.0 (0.5-2.0 NOTE: New Reference Range  07/02/14)   Cardiac:  04-Mar-16 18:03   Troponin I < 0.02 (0.00-0.05 0.05 ng/mL or less: NEGATIVE  Repeat testing in 3-6 hrs  if clinically indicated. >0.05 ng/mL: POTENTIAL  MYOCARDIAL INJURY. Repeat  testing in 3-6 hrs if  clinically indicated. NOTE: An increase or decrease  of 30% or more on serial  testing suggests a  clinically important change)  Routine UA:  02-Mar-16 18:10   Color (UA) Yellow  Clarity (UA) Hazy  Glucose (UA) Negative  Bilirubin (UA) Negative  Ketones (UA) Negative  Specific Gravity (UA) 1.031  Blood (UA) Negative  pH (UA) 6.0  Protein (UA) Negative  Nitrite (UA) Negative  Leukocyte Esterase (UA) 1+ (Result(s) reported on 10 Jul 2014 at 06:46PM.)  RBC (UA) NONE SEEN  WBC (UA) 2 /HPF  Bacteria (UA) NONE SEEN  Epithelial Cells (UA) 3 /HPF  Mucous (UA) PRESENT (Result(s) reported on 10 Jul 2014 at 06:46PM.)  Routine Coag:  06-Mar-16 05:28   Prothrombin 14.8 (11.4-15.0 NOTE: New Reference Range  06/07/14)  INR 1.1 (INR reference interval applies to patients on anticoagulant therapy. A single INR therapeutic range for coumarins is not optimal for all indications; however, the suggested range for most indications is 2.0 - 3.0. Exceptions to the INR Reference Range may include: Prosthetic heart valves, acute myocardial infarction, prevention of myocardial infarction, and combinations of aspirin and anticoagulant. The need for a higher or lower target INR must be assessed individually. Reference: The Pharmacology and Management of the Vitamin K  antagonists: the seventh ACCP Conference on Antithrombotic and Thrombolytic Therapy. CGHWEX.9371Sept:126 (3suppl): [id number] A HCT value >55% may artifactually increase the PT.  In one study,  the increase was an average of 25%. Reference:  "Effect on Routine and Special Coagulation Testing Values of Citrate Anticoagulant Adjustment in Patients with High HCT Values." American Journal of Clinical Pathology  2006;126:400-405.)  Activated PTT (APTT) 30.5 (A HCT value >55% may artifactually increase the APTT. In one  study, the increase was an average of 19%. Reference: "Effect on Routine and Special Coagulation Testing Values of Citrate Anticoagulant Adjustment in Patients with High HCT Values." American Journal of Clinical Pathology 2006;126:400-405.)  Routine Hem:  15-Mar-16 13:26   Bands -  Segmented Neutrophils -  Lymphocytes -  Variant Lymphocytes -  Monocytes -  Eosinophil -  Basophil -  Metamyelocyte -  Myelocyte -  Promyelocyte -  Blast-Like -  Other Cells -  NRBC -  Diff Comment 1 -  Diff Comment 2 -  Diff Comment 3 -  Diff Comment 4 -  Diff Comment 5 -  Diff Comment 6 -  Diff Comment 7 -  Diff Comment 8 -  Diff Comment 9 -  Diff Comment 10 - (Result(s) reported on 23 Jul 2014 at 01:47PM.)    13:45   WBC (CBC)  23.3  RBC (CBC)  3.28  Hemoglobin (CBC)  7.8  Hematocrit (CBC)  26.1  Platelet Count (CBC)  477  MCV 80  MCH  23.7  MCHC  29.8  RDW  20.1  Neutrophil % 85.8  Lymphocyte % 5.3  Monocyte % 7.6  Eosinophil % 0.9  Basophil % 0.4  Neutrophil #  20.7  Lymphocyte # 1.3  Monocyte #  1.9  Eosinophil # 0.2  Basophil # 0.1   Radiology Results: CT:    15-Mar-16 09:14, CT Head Without Contrast  CT Head Without Contrast   REASON FOR EXAM:    confusion, stage 4 colon CA  COMMENTS:       PROCEDURE: CT  - CT HEAD WITHOUT CONTRAST  - Jul 23 2014  9:14AM     CLINICAL DATA:  Confusion. Altered mental status. Atrial  fibrillation.    EXAM:  CT HEAD WITHOUT CONTRAST    TECHNIQUE:  Contiguous axial images were obtained from the base of the skull  through the vertex without intravenous contrast.  COMPARISON:  None.    FINDINGS:  Diffusely enlarged ventricles and subarachnoid spaces. Patchy white  matter low density in both cerebral hemispheres. No intracranial  hemorrhage, mass lesion or CT evidence of acute infarction. Small  right sphenoid sinus  retention cyst. Unremarkable bones.     IMPRESSION:  1. No acute abnormality.  2. Mild diffuse cerebral atrophy.  3. Minimalchronic small vessel white matter ischemic changes in  both cerebral hemispheres.    Electronically Signed    By: Claudie Revering M.D.    On: 07/23/2014 11:12         Verified By: Gerald Stabs, M.D.,   Radiology Impression: Radiology Impression: CT of head personally reviewed by me and shows mild white matter changes   Impression/Recommendations: Recommendations:   prior notes reviewed by me reviewed by me   Encephalopathy-  pt has worsening renal failure, hypercapnea, sepsis and recent surgery which is likely causing nutritional issues;  pt has improved already with just BiPAP therapy but pt can likely fluctuate as encephalopathy do.  Expect pt to have some confusion even a few days after this is corrected.   no EEG or MRI warranted at this time but if there is still a concern for brain masses would perform MRI of brain w/o contrast correct renal dysfuntion treat sepsis start nutrition will sign off, please call with additional questions  Electronic Signatures: Jamison Neighbor (MD)  (Signed 15-Mar-16 23:19)  Authored: REFERRING PHYSICIAN, Primary Care Physician, Consult, History of Present Illness, Review of Systems, PAST MEDICAL/SURGICAL HISTORY, HOME MEDICATIONS, ALLERGIES, Social/Family History,  NURSING VITAL SIGNS, Physical Exam-, LAB RESULTS, RADIOLOGY RESULTS, Recommendations   Last Updated: 15-Mar-16 23:19 by Jamison Neighbor (MD)

## 2014-09-08 NOTE — Op Note (Signed)
PATIENT NAME:  Andrea Vega, Andrea Vega MR#:  [id number] DATE OF BIRTH:  [birth year]  DATE OF PROCEDURE:  07/11/2014  PREOPERATIVE DIAGNOSES: Colonic obstruction and abscess.   POSTOPERATIVE DIAGNOSES: Colonic obstruction and abscess secondary to perforated colon cancer.   PROCEDURES: Exploratory laparotomy, drainage of retroperitoneal abscess, extended left hemicolectomy, appendectomy, end-colostomy.   SURGEON: Shiv Shuey E. Burt Knack, MD   ASSISTANT: [place], PA-S.   ANESTHESIA: General with endotracheal tube.   INDICATIONS: This is a patient with colonic obstruction, likely secondary to a perforated left colon and abscess formation in the abdominal wall seen on CT scan, suspicious for cancer.   Preoperatively, the patient, myself, and the family discussed the rationale for offering surgery and after correcting her electrolytes last night. The risks of bleeding, infection, recurrent infection, ongoing infection, the high likelihood of the colostomy, the potential for colon cancer being present versus diverticulitis, and the option and the risks associated with transfusion if needed. She understood and agreed to proceed.   FINDINGS: Perforated splenic flexure colon cancer. Perforating into the abdominal wall musculature with large abscess formation in the retroperitoneum with massively dilated proximal colon and small bowel and collapsed distal bowel. Signs of perforation of a colon cancer into the abdominal wall musculature with free stool extending into the abscess cavity.   DESCRIPTION OF PROCEDURE: The patient was induced to general anesthesia. A Foley catheter and nasogastric tube had been placed previously. VTE prophylaxis was in place. She was on IV antibiotics. She was then prepped and draped in a sterile fashion after being induced to general anesthesia.   A midline incision on the obese, rotund abdomen was performed and the abdominal cavity was opened. Clear serous fluid was cultured and then  aspirated and then exploration was performed. Large dilated small bowel loops were noted. The sigmoid colon appeared completely soft and collapsed. Further dissection up the left colon demonstrated an area of fullness and a proximal dilatation, the transverse colon and cecum were greatly dilated. There were no palpable liver metastases and the appendix appeared normal.   Dissection into this area of abscess formation was performed bluntly and a large amount of pus exuded. This was cultured and aspirated. Then further dissection demonstrated free flowing stool into this cavity. Sharp dissection and finger dissection and electrocautery dissection were performed in order to elevate the splenic flexure and the left colon. For convenience, the colon was divided just distal to what appeared to be a perforated colon cancer and then further dissection proximal and across towards the midline was performed utilizing electrocautery, division of vessels between clamps and tying with 0 silk ligatures or the use of the LDS stapler. The proximal side of the splenic flexure was then divided with the GIA, and the specimen was sent off for examination after inspecting it and finding it to be a likely perforated colon cancer.   Irrigation of the left upper quadrant was performed to remove stool and pus and then attention was turned to the left colon where devascularized left colon was identified; therefore, this was dissected further distally towards the rectosigmoid junction. This was removed in a similar fashion utilizing a GIA stapler and 0 silk ligatures. The ureter was inspected and found to be intact. It could not be identified in the area of phlegmonous abscess formation area up near the kidney.   The right colon was taken down to aid in further dissection and freeing up space for the colostomy. In so doing, the appendix was removed utilizing 0 silk ligatures on  the mesoappendix, and a 2-0 Vicryl suture ligature on the  base of the appendix. Specimen was sent off for examination. Omentum was removed to improve dissection along the transverse colon. This was performed with the LDS stapler as well as with 0 silk ligatures.   A site for colostomy was chosen and the colostomy rent was made in a standard fashion, cruciate-type incision. She had almost no abdominal wall rectus muscle in this area; it was very attenuated due to her deconditioning and obesity.   The colostomy was brought out through that and left flaccid in order to determine if it was to be viable, which it was later in the case; it was determined to be viable as inspected.   Approximately 7 L of irrigant was performed to irrigate and aspirate the abdominal cavity free of the free-floating stool that had been in the left side and contaminating the small bowel. Once this was clear, the wound was inspected. Then through 2 separate stab-type incisions were placed two 10 mm JP drains into this abscess cavity in the retroperitoneum lateral sidewall and perinephric space and tied in with 3-0 nylons. Marcaine was infiltrated in skin and subcutaneous tissues around the midline wound and colostomy for a total of 30 mL. The colostomy was inspected and found to be viable. Therefore, retention sutures were placed #2 nylon. Unfortunately, due to the size of the colostomy, only 2 could be used cephalad and caudad, none in the middle. These were placed over bolsters to be tied later. Two pieces of Seprafilm were placed after insuring that the sponge, lap, and needle counts were correct and verified by nursing. Once the Seprafilm was placed, the wound was closed with running #1 PDS followed by tying and closure of the retention sutures, and then skin staples were placed to close the skin over 1/4 inch Penrose drain after irrigating the subcutaneous tissues.   The colostomy was then matured in a standard fashion utilizing 3-0 Vicryl and an appliance was placed. The drains were  placed to bulb suction. Again, the sponge, lap, and needle counts were correct. The estimated blood loss was 150 mL. There was approximately 1 L of ascites removed, clear ascites at the beginning of the case, followed by 200 mL of pus from the retroperitoneum, and approximately 700 mL of a nasogastric aspirate. The patient made urine to an unknown specified amount, see the anesthesia records. She was taken to the ICU in critical care for continued care.    ____________________________ Jerrol Banana. Burt Knack, MD rec:bm D: 07/11/2014 17:47:27 ET T: 07/12/2014 02:49:25 ET JOB#: [id number]  cc: Jerrol Banana. Burt Knack, MD, <Dictator> Florene Glen MD ELECTRONICALLY SIGNED 07/12/2014 10:08

## 2014-09-08 NOTE — H&P (Signed)
PATIENT NAME:  Andrea Vega, GUYTON MR#:  [id number] DATE OF BIRTH:  June 17, 1950  DATE OF ADMISSION:  07/10/2014  CHIEF COMPLAINT: Abdominal pain.  HISTORY OF PRESENT ILLNESS: This is a patient with approximately 1-3 months of abdominal pain. She states she has not felt right since her gallbladder surgery performed by Dr. Marina Gravel back in December, but she states that most of it started as an ill feeling followed by left lower quadrant pain. She has been treated for the last 2 weeks with oral antibiotics for presumed diverticulitis. She has never had a colonoscopy before. Denies melena or hematochezia. Denies fevers or chills, but has been vomiting considerably over the last few days mostly in the last day or 2, and not able to keep any food down. She has never had an episode like this before, but this has been fairly long-standing over the last few weeks at least. She states that her pain is mostly on the left side and she is quite distended. She denies hematemesis.   PAST MEDICAL HISTORY: Diabetes, hypertension, obesity, and tobacco abuse, and depression. She is disabled due to a hip problem necessitating surgery in the future.   PAST SURGICAL HISTORY: Laparoscopic cholecystectomy in December 2015 by Dr. Marina Gravel; denies other surgeries.   ALLERGIES: PENICILLIN.   MEDICATIONS: Multiple, see reconciliation.   FAMILY HISTORY: Colon cancer in her mother, who is living and present at the time of exam.   SOCIAL HISTORY: The patient is disabled. She smokes tobacco. Does not drink alcohol.   REVIEW OF SYSTEMS: A complete system review was performed and negative with the exception of that mentioned in the HPI.   PHYSICAL EXAMINATION: VITAL SIGNS: Morbidly obese female patient with a BMI of 38, temperature of 98.7, pulse of 91, respirations 21, blood pressure 146/73, 94% room air saturation. Pain scale is 7.  HEENT: Shows very poor dentition. No scleral icterus. NECK: No palpable neck nodes.  CHEST: Shows  bilateral wheezes.  CARDIAC: Regular rate and rhythm.  ABDOMEN: Massively obese, distended, tympanitic, old laparoscopic cholecystectomy, laparoscopy scars are noted and well healed without erythema. She is minimally tender in the left lower quadrant. There is no rebound or percussion tenderness or guarding.  EXTREMITIES: Show moderate edema.  NEUROLOGIC: Grossly intact.  INTEGUMENT: Shows no jaundice.   LABORATORY DATA: Show profound electrolyte abnormalities with a sodium of 127, potassium at 3.1, a glucose of 184, a serum albumin of 2.4, lactic acid of 1.7. Hemogram demonstrates a white blood cell count of 24,000, H and H of 10.6 and 33, and a platelet count of 704,000. MCV of 74.   IMAGING:  CT scan is personally reviewed, suggestive of a phlegmon, although I believe this is likely an abscess of the iliopsoas area due to perforated diverticulitis. There is thickening, although a malignancy cannot be ruled out in this area. There is also extensive distention both of large bowel and small bowel proximal.  ASSESSMENT AND PLAN: This is a patient with a functional colonic bowel obstruction probably secondary to perforated diverticulitis with stricture, although a malignancy could be present and she has a family history of such. She also has a microcytic anemia that would be present in a patient with colon cancer as well. She has never had a colonoscopy.  Because of her profound electrolyte abnormalities, I have recommended admission to the hospital. This was all secondary to vomiting. She is also somewhat malnourished with an albumin of 2.4. I would like to rehydrate her, control her pain and nausea,  and treat her hyponatremia and hypokalemia and also use venous thromboembolism prophylaxis and then perform an exploratory laparotomy with colon resection and likely colostomy and abscess drainage. The rationale for this was discussed with her and multiple family members. The options of observation were  reviewed. The procedure was described in detail. The potential for a temporary or permanent colostomy was discussed. The potential for transfusion was discussed, but not in detail enough for consent for transfusion as that is not likely, but we will recheck her laboratories in the morning. After adequate hydration, it may be lower. This was all reviewed for her and her family. She understood and agreed to proceed. Dr. Joni Fears has been given her intravenous antibiotics, and a nasogastric tube and Foley catheter will be placed as well to adequately rehydrate and monitor her progress with surgery tomorrow morning.    ____________________________ Jerrol Banana. Burt Knack, MD rec:LT D: 07/10/2014 18:46:10 ET T: 07/10/2014 19:05:57 ET JOB#: [id number]  cc: Jerrol Banana. Burt Knack, MD, <Dictator> Florene Glen MD ELECTRONICALLY SIGNED 07/11/2014 7:51

## 2014-09-08 NOTE — Op Note (Signed)
PATIENT NAME:  Andrea Vega, Andrea Vega MR#:  [id number] DATE OF BIRTH:  10-21-1950  DATE OF PROCEDURE:  08/02/2014  PREOPERATIVE DIAGNOSIS: Acute on chronic renal insufficiency, non-recovering.   POSTOPERATIVE DIAGNOSIS: Acute on chronic renal insufficiency, non-recovering.  PROCEDURE PERFORMED: Insertion of right internal jugular cuffed tunneled dialysis catheter with ultrasound and fluoroscopic guidance.   SURGEON: Katha Cabal, MD    SEDATION: Versed plus fentanyl.   ACCESS: Right IJ.   FLUOROSCOPY TIME: Approximately 0.5 minutes.   CONTRAST USED: None.   INDICATIONS: Ms. Feher is a 64 year old woman who is improving overall and is now transferred out of the intensive care unit. They would like to initiate oral anticoagulation for her atrial fibrillation; however, her renal function has not recovered and, therefore, she is undergoing transition from a temporary femoral catheter to a cuffed tunneled dialysis catheter. Risks and benefits were reviewed with the daughter. All questions answered. The patient and daughter agreed to proceed.   DESCRIPTION OF PROCEDURE: The patient is taken to special procedures and placed in the supine position. After adequate sedation is achieved, the right neck and chest wall are prepped and draped in sterile fashion. Ultrasound is placed in a sterile sleeve. Ultrasound is utilized secondary to lack of appropriate landmarks and to avoid vascular injury. Under real-time visualization, jugular vein is accessed with a Seldinger needle after 1% lidocaine has been infiltrated in the soft tissues. A J-wire is advanced under fluoroscopy into the inferior vena cava. Counterincision is made at the wire insertion site, dilators are passed over the wire, and the dilator and peel-away sheath is inserted. A 19 cm tip-to-cuff Cannon catheter is then advanced over the wire and the peel-away sheath and wire are removed. The catheter is approximated to the chest wall. While  under fluoroscopy the tips are positioned at the proper location, and subsequently an exit site is selected. A small incision is created. The tunneling device is passed subcutaneously and the catheter is pulled through the tunnel. The catheter is then transected and the hub assembly connected without difficulty. Both lumens aspirate and flush easily. Under fluoroscopy, the tips are at the atriocaval junction and the catheter has a smooth contour. Therefore, both lumens are packed with 5000 units of heparin. The catheter is secured to the chest wall with 0 silk suture; 4-0 Monocryl subcuticular is used to close the neck counterincision. Dermabond is applied. Sterile dressing is applied. The patient tolerated the procedure well and there were no immediate complications.   ____________________________ Katha Cabal, MD ggs:bm D: 08/02/2014 17:03:50 ET T: 08/03/2014 02:25:26 ET JOB#: [id number]  cc: Katha Cabal, MD, <Dictator> Murlean Iba, MD Katha Cabal MD ELECTRONICALLY SIGNED 08/20/2014 15:10

## 2014-09-08 NOTE — Consult Note (Signed)
PATIENT NAME:  Andrea Vega, LADY MR#:  [id number] DATE OF BIRTH:  1951-03-24  DATE OF CONSULTATION:  07/12/2014  CONSULTING PHYSICIAN: Dr. Burt Knack   PRIMARY CARE PHYSICIAN: Located at the [facility].   CHIEF COMPLAINT: Palpitations supraventricular tachycardia and hypotension.   HISTORY OF PRESENT ILLNESS: This is a 64 year old female, who presented to the hospital two days ago due to abdominal pain. The patient was apparently been treated as an outpatient for acute diverticulitis with oral antibiotics. The patient's CT scan on admission was suggestive of colonic obstruction. The patient was taken to the Operating Room and was noted to have a colonic mass with an abdominal wall abscess. The patient underwent an exploratory laparotomy with drainage of the abscess and postoperative day 2 now. She is noted to be in septic shock and also noted to be atrial fibrillation/flutter. Hospitalist services were contacted for further treatment and evaluation. The patient presently only complains of palpitations and some vague abdominal pain, but no other associated symptoms. No chest pain. No shortness of breath. Positive nausea, but no vomiting. No diarrhea.   REVIEW OF SYSTEMS:  CONSTITUTIONAL: No documented fever. No weight gain, no weight loss.  EYES: No blurry or double vision.  ENT: No tinnitus. No postnasal drip. No redness of the oropharynx.  RESPIRATORY: No cough, no wheeze, no hemoptysis, no dyspnea.  CARDIOVASCULAR: Positive and no chest pain. No orthopnea. Positive palpitations, no syncope.  GASTROINTESTINAL: No nausea. No vomiting, and diarrhea. No abdominal pain or hematochezia.  GENITOURINARY: No dysuria or hematuria.  ENDOCRINE: No polyuria or nocturia. No heat or treated for thrush.  HEMATOLOGIC: No, no bruising, no bleeding.  INTEGUMENTARY: No rashes. No lesions.  MUSCULOSKELETAL: No arthritis, no swelling, no gout.  NEUROLOGIC: No numbness, tingling. No ataxia. No seizure-type activity.   PSYCHIATRIC: No anxiety, no insomnia, no ADD.   PAST MEDICAL HISTORY: Consistent with diabetes, hypertension, tobacco abuse. GERD.   ALLERGIES: PENICILLIN, WHICH CAUSES A RASH.   SOCIAL HISTORY: Does have a 40 pack-year smoking history. Occasional alcohol use. No illicit drug abuse. Lives at home by herself.   FAMILY HISTORY: The patient's father died from lung cancer. Her mother died from breast cancer.   CURRENT MEDICATIONS: As follows: Tylenol with hydrocodone 5/325 mg 1 tablets b.i.d. as needed, ciprofloxacin 500 mg b.i.d. hydrochlorothiazide 25 mg daily, meloxicam 15 mg daily, metformin 5 mg daily, Flagyl 5 mg q.i.d., omeprazole 20 mg daily, potassium 12 mEq daily, Zoloft 50 mg at bedtime.   PHYSICAL EXAMINATION: Presently is as follows:  VITAL SIGNS: Temperature is 99.5, pulse 170s, respirations 16, blood pressure 106/75, sats 95% on 50% Ventimask.  GENERAL: She is a pleasant-appearing female, somewhat lethargic, but in no apparent distress. HEENT: Atraumatic, normocephalic. Extraocular muscles are intact. Pupils react to light. Sclerae anicteric. No conjunctival injection. No pharyngeal erythema.  NECK: Supple. There is no jugular venous distention. No bruits. No lymphadenopathy. No thyromegaly.  HEART: Tachycardic. Irregular no murmurs, no rubs, no clicks.  LUNGS: Clear to auscultation anteriorly. Shallow breaths. No rales or rhonchi. No wheezes. Negative use of accessory muscles. Dullness to percussion.  ABDOMEN: Soft, distended. She has a positive colostomy bag and 2 JP drains. Hypoactive bowel sounds. No hepatosplenomegaly appreciated.  EXTREMITIES: No cyanosis, clubbing, or peripheral edema. Has +2 pedal and radial pulses bilaterally.  NEUROLOGIC: She is alert, awake and oriented x3 with no focal motor or sensory deficits appreciated.  SKIN: Moist and warm with no rashes appreciated.  LYMPHATIC: There is no cervical or axillary lymphadenopathy.  LABORATORY DATA: Serum glucose  of 207, BUN 13, creatinine 1.3, sodium 135, potassium 3.9, chloride 101 bicarb 24. The patient's LFTs are within normal limits. White cell count 24.2, hemoglobin 10.8, hematocrit 35.9. ABG showed a pH of 7.833, pCO2 of 42, pO2 58, saturations 91%.   ASSESSMENT AND PLAN: This is a 64 year old female with history of diabetes, hypertension, gastroesophageal reflux disease, tobacco abuse, who presented to the hospital with bowel obstruction, noted to have a colonic mass with abscess in the abdominal wall, is status post exploratory laparotomy with colostomy and postoperative day 2 now noted to be in rapid atrial fibrillation/flutter, also noted to be in septic shock.   1. Atrial flutter. This is likely due to underlying septic shock, possibly due to underlying hypomagnesemia. I will give her a bolus of intravenous Cardizem now and eventually start her on a Cardizem drip. We will get a 2-dimensional echocardiogram, get a cardiology consult. Discussed the case with Dr. Percival Spanish. I will also replace her magnesium, follow her electrolytes and follow hemodynamics  2. Shock. This is likely septic shock from intra-abdominal source. The patient had an abdominal wall/diverticular abscess, which was drained. I would continue IV Zosyn empirically. Follow blood cultures, continue aggressive intravenous fluid hydration. Continue IV Levophed. Keep MAPs greater than 60. Follow hemodynamics;  3. Acute renal failure. This is likely acute tubular necrosis from the septic shock. I will hydrate her with IV fluids, follow BUN and creatinine and urine output, renal dose medications, avoid nephrotoxins.  4. Leukocytosis, I suspect this is secondary to the septic shock. Follow white cell count with IV antibiotic therapy. 5. Obstructing colonic mass with diverticular abscess. The patient is status post exploratory laparotomy with colostomy and drainage of the abscess. She is postoperative day number 2. Continue care as per general  surgery. Continue pain control as per surgery, for now.  6. Acute respiratory failure with hypoxia. The exact etiology of this is also unclear, questionable if this is atelectasis, as the patient is splinting due to  abdominal pain, Questionable pulmonary embolism, or possible underlying pneumonia. I will get a CT chest with contrast to work this up further. Continue oxygen supplementation and follow serial ABGs and follow her clinically.   Thank you so much for the consultation. We will follow along with you.   CRITICAL CARE TIME SPENT: 60 minutes.  ____________________________ Belia Heman. Verdell Carmine, MD vjs:ap D: 07/12/2014 18:43:25 ET T: 07/12/2014 20:37:39 ET JOB#: [id number]  cc: Belia Heman. Verdell Carmine, MD, <Dictator> Henreitta Leber MD ELECTRONICALLY SIGNED 07/22/2014 14:52

## 2014-09-08 NOTE — Discharge Summary (Signed)
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   OTHER DIAGNOSES:  1.  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  2.  Acute renal failure secondary to ATN.  3.  Acute respiratory failure secondary to pulmonary edema and pleural effusion.  4.  Type 2 diabetes mellitus.  5.  Hypertension.  6.  Morbid obesity.  7.  Hip problem.  8.  Depression.  9.  Atrial fibrillation.  PRINCIPAL PROCEDURE PERFORMED DURING THIS ADMISSION:  On 07/10/2014, extended left hemicolectomy, omentectomy, appendectomy, colostomy, and abscess drainage.   OTHER PROCEDURES PERFORMED DURING THIS ADMISSION: Thoracentesis 07/25/2014, Perm Cath placement 08/01/2014.   HOSPITAL COURSE: The patient was admitted to the hospital and underwent the above-mentioned procedure, for the above-mentioned diagnosis.  Postoperatively, she had multiple medical problems due to her sepsis and hypotension and ultimately was transferred out of the Intensive Care Unit to the floor and by the time of discharge, she had been successfully anticoagulated on Coumadin and remained on IV Zosyn 3.37 grams every 12 hours and IV vancomycin that was timed with her hemodialysis.  At the time of discharge, she still had a marginal appetite, but she was eating better and having excellent ostomy output.  She was in good spirits and her white blood cell count was essentially stable at 14.  She had remained afebrile for quite a few days.  Her anticoagulation was due to atrial fibrillation that she developed postoperatively.   MEDICATIONS AT THE TIME OF DISCHARGE: Were as follows: Amiodarone 400 mg b.i.d., metoprolol 25 mg every  6 hours, midodrine 10 mg t.i.d., Zosyn 3.375 grams IV every 12 hours, Zoloft 50 mg at bedtime, Coumadin 5 mg q.p.m., budesonide 0.5 mg in 2 mL respiratory inhaled every 12 hours, Xopenex 1.25 mg every 4 hours while awake, Ensure Plus 1 can t.i.d. with meals.      ____________________________ Consuela Mimes, MD wfm:DT D: 08/06/2014 08:40:22 ET T: 08/06/2014 09:26:17 ET JOB#: [id number]  cc: Consuela Mimes, MD, <Dictator> Consuela Mimes MD ELECTRONICALLY SIGNED 08/06/2014 10:48

## 2014-09-08 NOTE — Consult Note (Signed)
Brief Consult Note: Diagnosis: 1. Atrial flutter/SVT 2. SEptic Shock 3. obstructing colonic mass w/ abscess w/p exp. lap & colostomy.  4. DM 5. HTN 6. GERD.   Patient was seen by consultant.   Consult note dictated.   Orders entered.   Discussed with Attending MD.   Comments: 64 yo female w/ hx of DM, HtN, GERd, Tobacco abuse, who presented to the hospital w/ bowel obstruction and noted to have colonic mass w/ abscess in abdominal wall s/p exp. lap w/ colostomy now noted to be rapid a. fib/flutter.  Also noted to be in septic Shock.   1. A. flutter - likely due to underlying septic shock. ?? related to hypomagnesemia.  - given bolus of cardizem but did not improve.  - will start on Cardizem gtt.  Echo, Cards consult.   - replace Magnesium and follow lytes.   2. Shock - likely septic shock from intraabdominal source.  - cont. IV zosyn empirically.  - cont. IV fluids, IV Levophed and keep MAP > 60.    3. Acute Renal Failure - likely ATN from septic Shock.  - will hydrate w/ IV fluids and follow BUN/Cr.  - renal dose meds and avoid nephrotoxins.   4. Leukocytosis - likely due to septic shock.  - follow WBC count w/ IV abx therapy.   5. obstructing colonic mass w/ diverticular abscess - s/p exp. lap w/ colostomy and drainage of abscess.  - cont. care as per gen. surgery.  -cont. pain control as per surgery.   6. Acute Resp. failure w/ hypoxia - etiology unclear.  ?? ateletasis (vs) PE (vs) pneumonia.  - will get CT chest w/ contrast.    Thanks for the consult and will follow with you.   Job # [id number].  Electronic Signatures: Henreitta Leber (MD)  (Signed 04-Mar-16 18:43)  Authored: Brief Consult Note   Last Updated: 04-Mar-16 18:43 by Henreitta Leber (MD)

## 2014-09-08 NOTE — Consult Note (Signed)
PATIENT NAME:  Andrea Vega, Andrea Vega MR#:  [id number] DATE OF BIRTH:  02-Jan-1951  DATE OF CONSULTATION:  07/23/2014  REFERRING PHYSICIAN:  Nicholes Mango, MD  CONSULTING PHYSICIAN:  Jaeger Trueheart R. Ma Hillock, MD  REASON FOR CONSULTATION:  Newly diagnosed colon cancer.  HISTORY OF PRESENT ILLNESS:  The patient is a 64 year old female with a past medical history significant for diabetes, hypertension, obesity, tobacco abuse, depression, disabled from a problem, who was admitted to the hospital on 07/10/2014, with abdominal pain. The patient had gallbladder surgery in December, but continued to have some pain issues. She had a CT scan of the abdomen and pelvis on March 2, which reported bowel obstruction caused by masslike thickening in the descending colon, stable left adrenal adenoma, diffuse hepatic steatosis, lumbar spondylosis, and degenerative disk disease. The patient then underwent partial colectomy on the left side, appendectomy, and colostomy on March 3 with pathology reporting tumor penetrating the visceral peritoneum with pT4a and pN1a with 1 of 13 regional lymph nodes positive for metastasis. 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 The patient currently is acutely sick with altered mental status, declining respiratory status, on BiPAP mask and oxygen, and in the critical care unit. She is unable to provide any history. Since surgery on March 3, the patient has had abscess and sepsis and atrial fibrillation on amiodarone. She had repeat CT scan of the chest, abdomen, and pelvis done today, which shows a large left pleural effusion and small right pleural effusion. The left effusion increased significantly compared to prior study. There was no mediastinal or axillary adenopathy. Liver, pancreas, right adrenal, and kidneys had unremarkable appearance and there was a stable 2.4 cm nodule on the left adrenal gland. There was a fluid collection noted surrounding the spleen,  possible subcapsular hematoma, stable since prior study. There were scattered air-fluid levels including a fluid collection in the left paracolic gutter measuring 6.5 x 6.2 cm, similar size to prior study. Oncology has been consulted for recent finding of colon cancer.   PAST MEDICAL HISTORY AND PAST SURGICAL HISTORY:  As in the HPI above.   FAMILY HISTORY:  Noncontributory.   SOCIAL HISTORY:  History of tobacco usage.   ALLERGIES:  INCLUDE ATIVAN, MORPHINE, AND PENICILLIN.   CURRENT MEDICATIONS:  Pepcid 20 mg IV q. 24 hours, Zosyn 3.375 grams IV q. 8 hours, heparin 5000 units subcutaneously q. 8 hours, insulin sliding scale, Lopressor 2.5 mg IV q. 6 hours, amiodarone drip, Lasix drip, norepinephrine drip, oxygen, morphine 2 to 4 mg IV q. 2 hours p.r.n., Zofran 4 mg IV q. 4 hours p.r.n., albuterol SVN 3 mL q. 4 hours p.r.n., Phenergan p.r.n.   REVIEW OF SYSTEMS:  Unable to obtain given altered mental status.   PHYSICAL EXAMINATION: GENERAL:  The patient is a moderately-built, obese individual, mildly agitated, on BiPAP and oxygen, noncommunicative but looks around with eyes open and answers in monosyllables. Tachypneic at rest.  VITAL SIGNS:  Temperature 97.3, pulse 66, respirations 18, blood pressure 105/59, saturation 98% on mask oxygen.  HEENT:  Normocephalic and atraumatic with extraocular motion intact. No oral thrush.  NECK:  Negative for lymphadenopathy.  CARDIOVASCULAR:  S1 and S2, regular.  LUNGS:  Show markedly decreased breath sounds in the left lower lobe area and a few crepitations. No rhonchi.  ABDOMEN:  Distended, firm. Bowel sounds heard. No obvious masses palpable.  EXTREMITIES:  Bilateral pedal edema. No cyanosis.  SKIN:  No generalized rashes or major bruising.  NEUROLOGIC:  Unable to examine. The patient is not cooperative, but moving all extremities spontaneously.  MUSCULOSKELETAL:  No obvious joint redness or swelling.   LABORATORY RESULTS:  Lactic acid is 1.0. Blood  culture is negative from earlier today. ABG shows pH of 7.18, pCO2 of 59, pO2 of 89, and oxygen saturation 96.9%. WBC is 23,300, hemoglobin 7.8, and platelets are 477,000 with 85% neutrophils. Creatinine is 2.9, BUN 50, calcium 7.5, sodium unremarkable. Plasma ammonia is 23.   IMPRESSION AND RECOMMENDATIONS:  A 64 year old female patient who has undergone colectomy and appendectomy on 07/11/2014, and pathology reports pT4a and pN1a (stage III) colon cancer. Pathology reported radial margin positive for colon cancer, unclear significance of this. 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 She did have some patchy lung infiltrates on prior CT scan earlier this month. These seem to be better on CT scan done today, but she has progressive large left pleural effusion and small right pleural effusion. Noncontrast CT of the head is negative for any obvious metastasis. Altered mental status is of unclear etiology, but likely metabolic etiology given the ongoing respiratory distress and acidosis. Recommend continuing to look for occult infection and ID consultation as indicated. The patient is unable to communicate given altered mental status. I have explained to her daughter [name] about the surgical pathology report of colon cancer, and that if the patient is doing well, then there will be indication for adjuvant chemotherapy. Otherwise, no active oncology issues at this time since CT scan does not report any definite metastatic disease. Will continue to follow intermittently as indicated and follow up once her condition improves.   Thank you for the referral. Please feel free to contact me if any additional questions.    ____________________________ Rhett Bannister Ma Hillock, MD srp:nb D: 07/24/2014 00:16:47 ET T: 07/24/2014 00:48:02 ET JOB#: [id number]  cc: Toribio Seiber R. Ma Hillock, MD, <Dictator> Alveta Heimlich MD ELECTRONICALLY SIGNED 07/24/2014 9:04

## 2014-09-08 NOTE — Consult Note (Signed)
PATIENT NAME:  Andrea Vega, MICHALOWSKI MR#:  [id number] DATE OF BIRTH:  [birth year]  DATE OF CONSULTATION:  07/25/2014  REFERRING PHYSICIAN:  Elta Guadeloupe A. Marina Gravel, MD  CONSULTING PHYSICIAN:  Cheral Marker. Ola Spurr, MD  REASON FOR CONSULTATION: Antibiotic management.   HISTORY OF PRESENT ILLNESS: This is a pleasant 64 year old female admitted March 3 with abdominal pain. She was found to have a perforated colon with an underlying malignancy. She underwent surgical resection on March 3 by Dr. Burt Knack. At that time, she was found to have in the splenic flexure a colon cancer that had perforated into the abdominal wall musculature with a large abscess in the retroperitoneum, with massively dilated proximal colon and small bowel and collapsed distal bowel. There was free stool extending into the abscess cavity. Patient since that time has had a complicated course. She says she has been seen by cardiology, pulmonary, and medicine. She has had issues with respiratory failure as well. She has had a persistently elevated white count throughout and has been covered broadly by antibiotics. There is concern for a residual 6 cm fluid collection.   PAST MEDICAL HISTORY: Diabetes, hypertension, obesity, tobacco abuse, depression, osteoarthritis.   PAST SURGICAL HISTORY: Laparoscopic cholecystectomy December 2015, surgeries this admission as above.   ALLERGIES: SHE IS ALLERGIC TO PENICILLIN; UNCLEAR REACTION.   FAMILY HISTORY: Positive for colon cancer in her mother.   SOCIAL HISTORY: She is disabled. Smokes tobacco. Does not drink.   REVIEW OF SYSTEMS: Unable to be obtained.  ANTIBIOTICS SINCE ADMISSION: Include: Levofloxacin begun February 16, Zosyn begun March 1, vancomycin begun March 4.   PHYSICAL EXAMINATION:  VITAL SIGNS: Temperature 98.7, pulse 70, blood pressure 86/54, respirations 20, saturation 99% on 4 L. GENERAL: She is critically ill-appearing.  HEENT: Pupils reactive. Sclerae anicteric. Oropharynx is dry.   NECK: Supple.  HEART: Regular.  LUNGS: Coarse breath sounds, bilateral bases.  ABDOMEN: Somewhat distended. There is a large abdominal incision with sutures in place. There is purulence around some of the line. She has 2 drains in the left lower quadrant. She has a colostomy site as well.  NEUROLOGIC: She is somewhat lethargic.   DATA: White blood count February 17 is 17.0, hemoglobin 6.5, platelets 447,000. Creatinine 4.96. Microbiology: Cultures from her abdominal abscess at the initial surgery March 3 grew MRSA, bacteroides, and fusobacterium. Blood cultures March 2 were negative. Blood cultures March 12 were negative. March 3 wound culture listed as being ascites was negative. Vaginal culture showed 3 white cells and yeast; no trichomonas noted. Urine culture March 15 negative. Thoracentesis March 16 negative.  IMAGING: CT of the abdomen March 15 showed enlarging left pleural effusion; trace right pleural effusion; postoperative changes in the left lower quadrant ostomy; probable mild small-bowel ileus; stable fluid collection along the spleen, question subcapsular hematoma versus loculated fluid. There was fluid also noted in the left paracolic gutter and free fluid in the cul-de-sac. The collection and paracolic clutter was 6.5 x 6.2 cm.   IMPRESSION: A 64 year old female admitted March 2 with abdominal pain and found to have colonic perforation with a large mass, ended up being adenocarcinoma at the splenic flexure. She had an abscess at that time. Cultures from the abdomen grew anaerobes as well as methicillin-resistant Staphylococcus aureus. She has been on vancomycin and Zosyn since. There is a residual fluid collection of 6.5 cm.   RECOMMENDATIONS:  1.  Continue vancomycin and Zosyn.  2.  Discontinue levofloxacin, as there is no need for double coverage.  3.  Consider IR drainage of the fluid collection in the left lower quadrant if white count persists elevated.  Thank you for the  consult. I will be glad to follow with you.    ____________________________ Cheral Marker. Ola Spurr, MD dpf:ST D: 07/25/2014 20:14:42 ET T: 07/25/2014 22:13:46 ET JOB#: [id number]  cc: Cheral Marker. Ola Spurr, MD, <Dictator> DAVID Ola Spurr MD ELECTRONICALLY SIGNED 08/01/2014 22:14

## 2014-09-08 NOTE — Discharge Summary (Signed)
PATIENT NAME:  Andrea Vega, Andrea Vega MR#:  [id number] DATE OF BIRTH:  05-Apr-1951  DATE OF ADMISSION:  04/30/2014 DATE OF DISCHARGE:  05/02/2014  FINAL DIAGNOSES:  1.  Acute calculus cholecystitis.  2.  Obesity.   3.  Adrenal lesion yet to be defined, likely hepatic steatosis.   HOSPITAL COURSE SUMMARY: The patient was admitted on December 22. Workup was consistent with acute calculus cholecystitis. She was taken to the operating room on December 23. Cholecystectomy was performed. A Jackson-Pratt drain was left in place. On postoperative day number 1 she was feeling well and was discharged home in stable condition. She will have outpatient followup with [place] in the office and she will require an outpatient colonoscopy and followup of her  adrenal lesion.   DISCHARGE MEDICATIONS: Can be found in the reconciliation form.      ____________________________ Jeannette How. Marina Gravel, MD mab:bu D: 05/17/2014 18:28:07 ET T: 05/17/2014 20:07:19 ET JOB#: [id number]  cc: Elta Guadeloupe A. Marina Gravel, MD, <Dictator> Hortencia Conradi MD ELECTRONICALLY SIGNED 05/18/2014 9:36

## 2014-09-09 LAB — RENAL FUNCTION PANEL
Albumin: 1.4 g/dL — ABNORMAL LOW (ref 3.5–5.0)
Anion gap: 6 (ref 5–15)
BUN: 18 mg/dL (ref 6–20)
CALCIUM: 7.7 mg/dL — AB (ref 8.9–10.3)
CO2: 32 mmol/L (ref 22–32)
Chloride: 102 mmol/L (ref 101–111)
Creatinine, Ser: 3.47 mg/dL — ABNORMAL HIGH (ref 0.44–1.00)
GFR calc Af Amer: 15 mL/min — ABNORMAL LOW (ref >60)
GFR calc non Af Amer: 13 mL/min — ABNORMAL LOW (ref >60)
Glucose, Bld: 76 mg/dL (ref 70–99)
Phosphorus: 4.5 mg/dL (ref 2.5–4.6)
Potassium: 3.3 mmol/L — ABNORMAL LOW (ref 3.5–5.1)
SODIUM: 140 mmol/L (ref 135–145)

## 2014-09-09 LAB — CBC
HCT: 22.6 % — ABNORMAL LOW (ref 36.0–46.0)
HEMATOCRIT: 21.8 % — AB (ref 36.0–46.0)
HEMOGLOBIN: 6.6 g/dL — AB (ref 12.0–15.0)
Hemoglobin: 6.8 g/dL — CL (ref 12.0–15.0)
MCH: 26.6 pg (ref 26.0–34.0)
MCH: 26.4 pg (ref 26.0–34.0)
MCHC: 30.1 g/dL (ref 30.0–36.0)
MCHC: 30.3 g/dL (ref 30.0–36.0)
MCV: 88.3 fL (ref 78.0–100.0)
MCV: 87.2 fL (ref 78.0–100.0)
PLATELETS: 237 10*3/uL (ref 150–400)
Platelets: 259 10*3/uL (ref 150–400)
RBC: 2.56 MIL/uL — ABNORMAL LOW (ref 3.87–5.11)
RBC: 2.5 MIL/uL — ABNORMAL LOW (ref 3.87–5.11)
RDW: 19 % — ABNORMAL HIGH (ref 11.5–15.5)
RDW: 19 % — AB (ref 11.5–15.5)
WBC: 11.8 10*3/uL — ABNORMAL HIGH (ref 4.0–10.5)
WBC: 11.4 10*3/uL — ABNORMAL HIGH (ref 4.0–10.5)

## 2014-09-09 LAB — HEPATITIS B CORE ANTIBODY, TOTAL: Hep B Core Total Ab: REACTIVE — AB

## 2014-09-09 LAB — PROTIME-INR
INR: 2.44 — AB (ref 0.00–1.49)
Prothrombin Time: 26.7 seconds — ABNORMAL HIGH (ref 11.6–15.2)

## 2014-09-09 LAB — PREPARE RBC (CROSSMATCH)

## 2014-09-09 NOTE — Progress Notes (Signed)
Subjective: Pt completed HD today. Tolerated well.   No net UF performed.  Objective: 97.9 67 20 120/60 Gen: NAD, laying in bed HEENT: [place]/AT EOMI hearing intact. Neck: supple Lungs: scattered rhonchi normal effort CV: S1S2 no rubs Abd: left LQ ostomy, midline incision with dressings. drain in place  Ext: trace b/l LE edema Neuro: A+Ox 3, follows commands Access: RIJ permcath  Lab Results: Results for orders placed or performed during the hospital encounter of 08/06/14 (from the past 24 hour(s))  Protime-INR     Status: Abnormal   Collection Time: 09/09/14  6:22 AM  Result Value Ref Range   Prothrombin Time 26.7 (H) 11.6 - 15.2 seconds   INR 2.44 (H) 0.00 - 1.49  Renal function panel     Status: Abnormal   Collection Time: 09/09/14  6:22 AM  Result Value Ref Range   Sodium 140 135 - 145 mmol/L   Potassium 3.3 (L) 3.5 - 5.1 mmol/L   Chloride 102 101 - 111 mmol/L   CO2 32 22 - 32 mmol/L   Glucose, Bld 76 70 - 99 mg/dL   BUN 18 6 - 20 mg/dL   Creatinine, Ser 3.47 (H) 0.44 - 1.00 mg/dL   Calcium 7.7 (L) 8.9 - 10.3 mg/dL   Phosphorus 4.5 2.5 - 4.6 mg/dL   Albumin 1.4 (L) 3.5 - 5.0 g/dL   GFR calc non Af Amer 13 (L) >60 mL/min   GFR calc Af Amer 15 (L) >60 mL/min   Anion gap 6 5 - 15  Hepatitis B core antibody, total     Status: Abnormal   Collection Time: 09/09/14  6:22 AM  Result Value Ref Range   Hep B Core Total Ab Reactive (A) NON REACTIVE  CBC     Status: Abnormal   Collection Time: 09/09/14  6:22 AM  Result Value Ref Range   WBC 11.8 (H) 4.0 - 10.5 K/uL   RBC 2.56 (L) 3.87 - 5.11 MIL/uL   Hemoglobin 6.8 (LL) 12.0 - 15.0 g/dL   HCT 22.6 (L) 36.0 - 46.0 %   MCV 88.3 78.0 - 100.0 fL   MCH 26.6 26.0 - 34.0 pg   MCHC 30.1 30.0 - 36.0 g/dL   RDW 19.0 (H) 11.5 - 15.5 %   Platelets 237 150 - 400 K/uL  CBC     Status: Abnormal   Collection Time: 09/09/14  7:25 AM  Result Value Ref Range   WBC 11.4 (H) 4.0 - 10.5 K/uL   RBC 2.50 (L) 3.87 - 5.11 MIL/uL   Hemoglobin  6.6 (LL) 12.0 - 15.0 g/dL   HCT 21.8 (L) 36.0 - 46.0 %   MCV 87.2 78.0 - 100.0 fL   MCH 26.4 26.0 - 34.0 pg   MCHC 30.3 30.0 - 36.0 g/dL   RDW 19.0 (H) 11.5 - 15.5 %   Platelets 259 150 - 400 K/uL  Type and screen     Status: None (Preliminary result)   Collection Time: 09/09/14 11:25 AM  Result Value Ref Range   ABO/RH(D) A POS    Antibody Screen NEG    Sample Expiration 09/12/2014    Unit Number [id number]    Blood Component Type RED CELLS,LR    Unit division 00    Status of Unit ALLOCATED    Transfusion Status OK TO TRANSFUSE    Crossmatch Result Compatible    Unit Number [id number]    Blood Component Type RED CELLS,LR    Unit division 00  Status of Unit ALLOCATED    Transfusion Status OK TO TRANSFUSE    Crossmatch Result Compatible   Prepare RBC     Status: None   Collection Time: 09/09/14 12:52 PM  Result Value Ref Range   Order Confirmation ORDER PROCESSED BY BLOOD BANK     Studies/Results: Dg Chest Port 1 View  09/08/2014   CLINICAL DATA:  Central line placement.  Pleural effusion.  EXAM: PORTABLE CHEST - 1 VIEW  COMPARISON:  09/04/2014  FINDINGS: Left IJ central line tip: Lower SVC. Right stool lumen catheter tip: Cavoatrial junction.  Cardiomegaly noted with moderate to large layering left pleural effusion. Thoracic spondylosis.  IMPRESSION: 1. The only change from prior is a new left internal jugular catheter which terminates in the SVC. No pneumothorax. Stable left effusion and cardiomegaly.   Electronically Signed   By: Van Clines M.D.   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 hep B core AB and surface Ab positive suggesting prior exposure  1. ESRD on HD MWF: Pt had HD today, UF achieved was 0, next HD on Wednesday, will prepare orders. 2. Anemia of CKD: hgb  was down to 6.6, pt transfused, monitor hgb. No epogen given malignancy. 3. Hypertension. BP well controlled, continue amlodipine. 4. Secondary Hyperparathyroidism:  Phos 4.5 and acceptable, not on binders, will monitor phos.    Torrey Horseman 09/09/2014,4:12 PM

## 2014-09-10 LAB — PROTIME-INR
INR: 2.61 — ABNORMAL HIGH (ref 0.00–1.49)
Prothrombin Time: 28.1 seconds — ABNORMAL HIGH (ref 11.6–15.2)

## 2014-09-10 LAB — HEMOGLOBIN AND HEMATOCRIT, BLOOD
HEMATOCRIT: 22.1 % — AB (ref 36.0–46.0)
HEMOGLOBIN: 6.8 g/dL — AB (ref 12.0–15.0)

## 2014-09-11 ENCOUNTER — Other Ambulatory Visit (HOSPITAL_COMMUNITY): Payer: Self-pay

## 2014-09-11 LAB — RENAL FUNCTION PANEL
Albumin: 1.5 g/dL — ABNORMAL LOW (ref 3.5–5.0)
Anion gap: 10 (ref 5–15)
BUN: 14 mg/dL (ref 6–20)
CALCIUM: 7.9 mg/dL — AB (ref 8.9–10.3)
CO2: 29 mmol/L (ref 22–32)
CREATININE: 2.73 mg/dL — AB (ref 0.44–1.00)
Chloride: 102 mmol/L (ref 101–111)
GFR calc Af Amer: 20 mL/min — ABNORMAL LOW (ref >60)
GFR calc non Af Amer: 18 mL/min — ABNORMAL LOW (ref >60)
GLUCOSE: 90 mg/dL (ref 70–99)
Phosphorus: 3.6 mg/dL (ref 2.5–4.6)
Potassium: 3 mmol/L — ABNORMAL LOW (ref 3.5–5.1)
SODIUM: 141 mmol/L (ref 135–145)

## 2014-09-11 LAB — TYPE AND SCREEN
ABO/RH(D): A POS
ANTIBODY SCREEN: NEGATIVE
UNIT DIVISION: 0
UNIT DIVISION: 0

## 2014-09-11 LAB — CBC
HEMATOCRIT: 28.2 % — AB (ref 36.0–46.0)
Hemoglobin: 8.5 g/dL — ABNORMAL LOW (ref 12.0–15.0)
MCH: 26.2 pg (ref 26.0–34.0)
MCHC: 30.1 g/dL (ref 30.0–36.0)
MCV: 87 fL (ref 78.0–100.0)
Platelets: 162 10*3/uL (ref 150–400)
RBC: 3.24 MIL/uL — ABNORMAL LOW (ref 3.87–5.11)
RDW: 18 % — ABNORMAL HIGH (ref 11.5–15.5)
WBC: 9.9 10*3/uL (ref 4.0–10.5)

## 2014-09-11 LAB — PROTIME-INR
INR: 2.63 — ABNORMAL HIGH (ref 0.00–1.49)
Prothrombin Time: 28.3 seconds — ABNORMAL HIGH (ref 11.6–15.2)

## 2014-09-11 LAB — OCCULT BLOOD X 1 CARD TO LAB, STOOL: Fecal Occult Bld: NEGATIVE

## 2014-09-11 LAB — HEMOGLOBIN AND HEMATOCRIT, BLOOD
HCT: 25.9 % — ABNORMAL LOW (ref 36.0–46.0)
Hemoglobin: 7.9 g/dL — ABNORMAL LOW (ref 12.0–15.0)

## 2014-09-11 LAB — HEPATITIS B SURFACE ANTIGEN: Hepatitis B Surface Ag: NEGATIVE

## 2014-09-11 NOTE — Progress Notes (Signed)
Subjective: Pt had HD today, tolerated well. In good spirits today.   Objective: 97.3 91 16 126/63 Gen: NAD, laying in bed HEENT: [place]/AT EOMI hearing intact. Neck: supple Lungs: scattered rhonchi normal effort CV: S1S2 no rubs Abd: left LQ ostomy, midline incision with dressings. drain in place  Ext: trace b/l LE edema Neuro: A+Ox 3, follows commands Access: RIJ permcath  Lab Results: Results for orders placed or performed during the hospital encounter of 08/06/14 (from the past 24 hour(s))  Occult blood card to lab, stool     Status: None   Collection Time: 09/10/14  9:00 PM  Result Value Ref Range   Fecal Occult Bld NEGATIVE NEGATIVE  Protime-INR     Status: Abnormal   Collection Time: 09/11/14  7:55 AM  Result Value Ref Range   Prothrombin Time 28.3 (H) 11.6 - 15.2 seconds   INR 2.63 (H) 0.00 - 1.49  Hemoglobin and hematocrit, blood     Status: Abnormal   Collection Time: 09/11/14  7:55 AM  Result Value Ref Range   Hemoglobin 7.9 (L) 12.0 - 15.0 g/dL   HCT 25.9 (L) 36.0 - 46.0 %  Renal function panel     Status: Abnormal   Collection Time: 09/11/14 10:00 AM  Result Value Ref Range   Sodium 141 135 - 145 mmol/L   Potassium 3.0 (L) 3.5 - 5.1 mmol/L   Chloride 102 101 - 111 mmol/L   CO2 29 22 - 32 mmol/L   Glucose, Bld 90 70 - 99 mg/dL   BUN 14 6 - 20 mg/dL   Creatinine, Ser 2.73 (H) 0.44 - 1.00 mg/dL   Calcium 7.9 (L) 8.9 - 10.3 mg/dL   Phosphorus 3.6 2.5 - 4.6 mg/dL   Albumin 1.5 (L) 3.5 - 5.0 g/dL   GFR calc non Af Amer 18 (L) >60 mL/min   GFR calc Af Amer 20 (L) >60 mL/min   Anion gap 10 5 - 15  CBC     Status: Abnormal   Collection Time: 09/11/14 10:00 AM  Result Value Ref Range   WBC 9.9 4.0 - 10.5 K/uL   RBC 3.24 (L) 3.87 - 5.11 MIL/uL   Hemoglobin 8.5 (L) 12.0 - 15.0 g/dL   HCT 28.2 (L) 36.0 - 46.0 %   MCV 87.0 78.0 - 100.0 fL   MCH 26.2 26.0 - 34.0 pg   MCHC 30.1 30.0 - 36.0 g/dL   RDW 18.0 (H) 11.5 - 15.5 %   Platelets 162 150 - 400 K/uL     Studies/Results: Ct Abdomen Pelvis Wo Contrast  09/11/2014   CLINICAL DATA:  Followup fluid collections, drains.  Abscess.  EXAM: CT ABDOMEN AND PELVIS WITHOUT CONTRAST  TECHNIQUE: Multidetector CT imaging of the abdomen and pelvis was performed following the standard protocol without IV contrast.  COMPARISON:  CT 08/27/2014 and 08/14/2014.  FINDINGS: Moderate left pleural effusion and small right pleural effusion again noted, similar prior study. Compressive atelectasis in the lower lobes bilaterally.  Left subcapsular splenic drain remains in place. Subcapsular fluid collection again noted, measuring approximately 9.7 x 2.0 cm compared with 913 x 2.2 cm previously. Likely not significantly changed. Drainage catheter remains in the left paracolic gutter region, with surrounding soft tissue thickening but no measurable fluid collection. Prior left colectomy and midline ostomy. Stranding noted within the mesentery and omentum, stable since prior study, likely postoperative.  She stable hepatomegaly. No visible focal abnormality. Stable nodule in the left adrenal gland. Right ventricle and is unremarkable. Pancreas  grossly unremarkable.  There is mild fullness of the left renal collecting system and proximal ureter. This may be related to inflammation in the left retroperitoneum adjacent to the mid left ureter. This fullness is new since prior study. Small low-density area in the midpole of the right kidney, likely cyst although this cannot be characterized without intravenous contrast. Stable. The right hydronephrosis.  Uterus, adnexae and urinary bladder are unremarkable.  IMPRESSION: Stable subcapsular collection adjacent to the spleen with drainage catheter in stable position. Left paracolic gutter draining remains in place without measurable fluid. No new fluid collection.  Bilateral pleural effusions, left larger than right with bibasilar atelectasis. Findings stable.  Mild fullness of the left renal  collecting system and proximal ureter, possibly related to inflammation/stranding in the left retroperitoneum, presumably related to prior surgery. Inflammation also noted throughout the mesentery and, stable.   Electronically Signed   By: Rolm Baptise M.D.   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 hep B core AB and surface Ab positive suggesting prior exposure  1. ESRD on HD MWF: Pt had HD today, tolerated well, next HD on Friday. 2. Anemia of CKD: hgb up to 8.5 post transfusion, avoiding epogen. 3. Hypertension. BP 126/63 today, continue amlodipine. 4. Secondary Hyperparathyroidism:  Phos 3.6 and acceptable, follow up phos with next HD.      [name] 09/11/2014,6:28 PM

## 2014-09-12 LAB — PROTIME-INR
INR: 3.05 — AB (ref 0.00–1.49)
Prothrombin Time: 31.8 seconds — ABNORMAL HIGH (ref 11.6–15.2)

## 2014-09-13 LAB — CBC
HCT: 27.6 % — ABNORMAL LOW (ref 36.0–46.0)
Hemoglobin: 8.4 g/dL — ABNORMAL LOW (ref 12.0–15.0)
MCH: 26.6 pg (ref 26.0–34.0)
MCHC: 30.4 g/dL (ref 30.0–36.0)
MCV: 87.3 fL (ref 78.0–100.0)
PLATELETS: 221 10*3/uL (ref 150–400)
RBC: 3.16 MIL/uL — ABNORMAL LOW (ref 3.87–5.11)
RDW: 18.2 % — AB (ref 11.5–15.5)
WBC: 11 10*3/uL — AB (ref 4.0–10.5)

## 2014-09-13 LAB — PROTIME-INR
INR: 3.19 — ABNORMAL HIGH (ref 0.00–1.49)
PROTHROMBIN TIME: 32.9 s — AB (ref 11.6–15.2)

## 2014-09-13 LAB — RENAL FUNCTION PANEL
Albumin: 1.5 g/dL — ABNORMAL LOW (ref 3.5–5.0)
Anion gap: 8 (ref 5–15)
BUN: 12 mg/dL (ref 6–20)
CHLORIDE: 105 mmol/L (ref 101–111)
CO2: 29 mmol/L (ref 22–32)
Calcium: 8 mg/dL — ABNORMAL LOW (ref 8.9–10.3)
Creatinine, Ser: 2.81 mg/dL — ABNORMAL HIGH (ref 0.44–1.00)
GFR calc non Af Amer: 17 mL/min — ABNORMAL LOW (ref >60)
GFR, EST AFRICAN AMERICAN: 20 mL/min — AB (ref >60)
GLUCOSE: 75 mg/dL (ref 70–99)
POTASSIUM: 3.2 mmol/L — AB (ref 3.5–5.1)
Phosphorus: 3.8 mg/dL (ref 2.5–4.6)
SODIUM: 142 mmol/L (ref 135–145)

## 2014-09-13 NOTE — Progress Notes (Signed)
Subjective: Pt had HD in bed today, ended up requiring bipap again. Awake and alert and following commands.  Objective: 98.1 66 17 113/63 Gen: NAD, laying in bed, on bipap HEENT: [place]/AT EOMI hearing intact. Neck: supple Lungs: scattered rhonchi normal effort on bipap CV: S1S2 no rubs Abd: left LQ ostomy, soft NT Ext: 1+ b/l LE edema Neuro: A+Ox 3, follows commands Access: RIJ permcath  Lab Results: Results for orders placed or performed during the hospital encounter of 08/06/14 (from the past 24 hour(s))  Protime-INR     Status: Abnormal   Collection Time: 09/13/14  5:49 AM  Result Value Ref Range   Prothrombin Time 32.9 (H) 11.6 - 15.2 seconds   INR 3.19 (H) 0.00 - 1.49  CBC     Status: Abnormal   Collection Time: 09/13/14  5:49 AM  Result Value Ref Range   WBC 11.0 (H) 4.0 - 10.5 K/uL   RBC 3.16 (L) 3.87 - 5.11 MIL/uL   Hemoglobin 8.4 (L) 12.0 - 15.0 g/dL   HCT 27.6 (L) 36.0 - 46.0 %   MCV 87.3 78.0 - 100.0 fL   MCH 26.6 26.0 - 34.0 pg   MCHC 30.4 30.0 - 36.0 g/dL   RDW 18.2 (H) 11.5 - 15.5 %   Platelets 221 150 - 400 K/uL  Renal function panel     Status: Abnormal   Collection Time: 09/13/14  5:49 AM  Result Value Ref Range   Sodium 142 135 - 145 mmol/L   Potassium 3.2 (L) 3.5 - 5.1 mmol/L   Chloride 105 101 - 111 mmol/L   CO2 29 22 - 32 mmol/L   Glucose, Bld 75 70 - 99 mg/dL   BUN 12 6 - 20 mg/dL   Creatinine, Ser 2.81 (H) 0.44 - 1.00 mg/dL   Calcium 8.0 (L) 8.9 - 10.3 mg/dL   Phosphorus 3.8 2.5 - 4.6 mg/dL   Albumin 1.5 (L) 3.5 - 5.0 g/dL   GFR calc non Af Amer 17 (L) >60 mL/min   GFR calc Af Amer 20 (L) >60 mL/min   Anion gap 8 5 - 15    Studies/Results: Ct Abdomen Pelvis Wo Contrast  09/11/2014   CLINICAL DATA:  Followup fluid collections, drains.  Abscess.  EXAM: CT ABDOMEN AND PELVIS WITHOUT CONTRAST  TECHNIQUE: Multidetector CT imaging of the abdomen and pelvis was performed following the standard protocol without IV contrast.  COMPARISON:  CT 08/27/2014  and 08/14/2014.  FINDINGS: Moderate left pleural effusion and small right pleural effusion again noted, similar prior study. Compressive atelectasis in the lower lobes bilaterally.  Left subcapsular splenic drain remains in place. Subcapsular fluid collection again noted, measuring approximately 9.7 x 2.0 cm compared with 913 x 2.2 cm previously. Likely not significantly changed. Drainage catheter remains in the left paracolic gutter region, with surrounding soft tissue thickening but no measurable fluid collection. Prior left colectomy and midline ostomy. Stranding noted within the mesentery and omentum, stable since prior study, likely postoperative.  She stable hepatomegaly. No visible focal abnormality. Stable nodule in the left adrenal gland. Right ventricle and is unremarkable. Pancreas grossly unremarkable.  There is mild fullness of the left renal collecting system and proximal ureter. This may be related to inflammation in the left retroperitoneum adjacent to the mid left ureter. This fullness is new since prior study. Small low-density area in the midpole of the right kidney, likely cyst although this cannot be characterized without intravenous contrast. Stable. The right hydronephrosis.  Uterus, adnexae and urinary  bladder are unremarkable.  IMPRESSION: Stable subcapsular collection adjacent to the spleen with drainage catheter in stable position. Left paracolic gutter draining remains in place without measurable fluid. No new fluid collection.  Bilateral pleural effusions, left larger than right with bibasilar atelectasis. Findings stable.  Mild fullness of the left renal collecting system and proximal ureter, possibly related to inflammation/stranding in the left retroperitoneum, presumably related to prior surgery. Inflammation also noted throughout the mesentery and, stable.   Electronically Signed   By: Rolm Baptise M.D.   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 hep B core AB and surface Ab positive suggesting prior exposure  1. ESRD on HD MWF: Pt completed HD today, UF achieved was 1.5kg, next HD on Monday 2. Anemia of CKD: hgb 8.4, avoiding epogen due to malignancy, follow up CBC Monday. 3. Hypertension. BP 114/58, continue amlodipine. 4. Secondary Hyperparathyroidism:  phos remains at target at 3.8, will follow up on Monday   Camela Wich 09/13/2014,9:59 AM

## 2014-09-14 LAB — PROTIME-INR
INR: 2.07 — ABNORMAL HIGH (ref 0.00–1.49)
Prothrombin Time: 23.5 seconds — ABNORMAL HIGH (ref 11.6–15.2)

## 2014-09-15 LAB — PROTIME-INR
INR: 1.85 — AB (ref 0.00–1.49)
Prothrombin Time: 21.5 seconds — ABNORMAL HIGH (ref 11.6–15.2)

## 2014-09-16 LAB — CBC
HCT: 25.5 % — ABNORMAL LOW (ref 36.0–46.0)
HEMOGLOBIN: 7.8 g/dL — AB (ref 12.0–15.0)
MCH: 27 pg (ref 26.0–34.0)
MCHC: 30.6 g/dL (ref 30.0–36.0)
MCV: 88.2 fL (ref 78.0–100.0)
Platelets: 216 10*3/uL (ref 150–400)
RBC: 2.89 MIL/uL — ABNORMAL LOW (ref 3.87–5.11)
RDW: 18.1 % — ABNORMAL HIGH (ref 11.5–15.5)
WBC: 9.9 10*3/uL (ref 4.0–10.5)

## 2014-09-16 LAB — PROTIME-INR
INR: 1.88 — ABNORMAL HIGH (ref 0.00–1.49)
PROTHROMBIN TIME: 21.8 s — AB (ref 11.6–15.2)

## 2014-09-16 LAB — RENAL FUNCTION PANEL
Albumin: 1.6 g/dL — ABNORMAL LOW (ref 3.5–5.0)
Anion gap: 10 (ref 5–15)
BUN: 22 mg/dL — ABNORMAL HIGH (ref 6–20)
CO2: 28 mmol/L (ref 22–32)
Calcium: 7.8 mg/dL — ABNORMAL LOW (ref 8.9–10.3)
Chloride: 101 mmol/L (ref 101–111)
Creatinine, Ser: 2.94 mg/dL — ABNORMAL HIGH (ref 0.44–1.00)
GFR calc non Af Amer: 16 mL/min — ABNORMAL LOW (ref >60)
GFR, EST AFRICAN AMERICAN: 19 mL/min — AB (ref >60)
Glucose, Bld: 73 mg/dL (ref 70–99)
POTASSIUM: 2.9 mmol/L — AB (ref 3.5–5.1)
Phosphorus: 4.8 mg/dL — ABNORMAL HIGH (ref 2.5–4.6)
SODIUM: 139 mmol/L (ref 135–145)

## 2014-09-16 NOTE — Progress Notes (Signed)
Subjective: Pt had HD today, didn't have episodie of anxiety or require bipap during HD as shes had recently. PT also working closely with pt.  Objective: 96 70 20 130/68 Gen: NAD, laying in bed HEENT: Andrea Vega/AT EOMI hearing intact. Neck: supple Lungs: CTAB normal effort CV: S1S2 no rubs Abd: left LQ ostomy, soft NTND Ext: trace b/l LE edema Neuro: A+Ox 3, follows commands Access: RIJ permcath  Lab Results: Results for orders placed or performed during the hospital encounter of 08/06/14 (from the past 24 hour(s))  Protime-INR     Status: Abnormal   Collection Time: 09/16/14  6:43 AM  Result Value Ref Range   Prothrombin Time 21.8 (H) 11.6 - 15.2 seconds   INR 1.88 (H) 0.00 - 1.49  CBC     Status: Abnormal   Collection Time: 09/16/14  6:43 AM  Result Value Ref Range   WBC 9.9 4.0 - 10.5 K/uL   RBC 2.89 (L) 3.87 - 5.11 MIL/uL   Hemoglobin 7.8 (L) 12.0 - 15.0 g/dL   HCT 25.5 (L) 36.0 - 46.0 %   MCV 88.2 78.0 - 100.0 fL   MCH 27.0 26.0 - 34.0 pg   MCHC 30.6 30.0 - 36.0 g/dL   RDW 18.1 (H) 11.5 - 15.5 %   Platelets 216 150 - 400 K/uL  Renal function panel     Status: Abnormal   Collection Time: 09/16/14  6:44 AM  Result Value Ref Range   Sodium 139 135 - 145 mmol/L   Potassium 2.9 (L) 3.5 - 5.1 mmol/L   Chloride 101 101 - 111 mmol/L   CO2 28 22 - 32 mmol/L   Glucose, Bld 73 70 - 99 mg/dL   BUN 22 (H) 6 - 20 mg/dL   Creatinine, Ser 2.94 (H) 0.44 - 1.00 mg/dL   Calcium 7.8 (L) 8.9 - 10.3 mg/dL   Phosphorus 4.8 (H) 2.5 - 4.6 mg/dL   Albumin 1.6 (L) 3.5 - 5.0 g/dL   GFR calc non Af Amer 16 (L) >60 mL/min   GFR calc Af Amer 19 (L) >60 mL/min   Anion gap 10 5 - 15    Studies/Results: No results found.     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 hep B core AB and surface Ab positive suggesting prior  exposure  1. ESRD on HD MWF: BUN/Cr did trend up off of HD.  UOP seems to be increasing.  Will perform 24 hour urine for CrCl.  Will tentatively plan for HD again on Wednesday. 2. Anemia of CKD: hgb down to 7.8, avoiding epogen as before given malignancy. 3. Hypertension. BP 130/68, continue current amlodipine 5mg  po daily. 4. Secondary Hyperparathyroidism:  phos 4.8 and acceptable, will continue to follow bone minearl metabolism. 5.  Hypokalemia:   Andrea Vega 09/16/2014,3:18 PM

## 2014-09-17 LAB — CREATININE CLEARANCE, URINE, 24 HOUR
Collection Interval-CRCL: 24 hours
Creatinine Clearance: 5 mL/min — ABNORMAL LOW (ref 75–115)
Creatinine, 24H Ur: 159 mg/d — ABNORMAL LOW (ref 600–1800)
Creatinine, Urine: 22.77 mg/dL
URINE TOTAL VOLUME-CRCL: 700 mL

## 2014-09-17 LAB — PROTIME-INR
INR: 2.28 — ABNORMAL HIGH (ref 0.00–1.49)
Prothrombin Time: 25.3 seconds — ABNORMAL HIGH (ref 11.6–15.2)

## 2014-09-18 LAB — CBC
HCT: 26.4 % — ABNORMAL LOW (ref 36.0–46.0)
Hemoglobin: 8 g/dL — ABNORMAL LOW (ref 12.0–15.0)
MCH: 26.6 pg (ref 26.0–34.0)
MCHC: 30.3 g/dL (ref 30.0–36.0)
MCV: 87.7 fL (ref 78.0–100.0)
PLATELETS: 175 10*3/uL (ref 150–400)
RBC: 3.01 MIL/uL — AB (ref 3.87–5.11)
RDW: 18.3 % — AB (ref 11.5–15.5)
WBC: 10.1 10*3/uL (ref 4.0–10.5)

## 2014-09-18 LAB — RENAL FUNCTION PANEL
ANION GAP: 9 (ref 5–15)
Albumin: 1.6 g/dL — ABNORMAL LOW (ref 3.5–5.0)
BUN: 13 mg/dL (ref 6–20)
CO2: 28 mmol/L (ref 22–32)
Calcium: 8.1 mg/dL — ABNORMAL LOW (ref 8.9–10.3)
Chloride: 102 mmol/L (ref 101–111)
Creatinine, Ser: 2.67 mg/dL — ABNORMAL HIGH (ref 0.44–1.00)
GFR calc Af Amer: 21 mL/min — ABNORMAL LOW (ref >60)
GFR calc non Af Amer: 18 mL/min — ABNORMAL LOW (ref >60)
GLUCOSE: 83 mg/dL (ref 70–99)
PHOSPHORUS: 3.9 mg/dL (ref 2.5–4.6)
POTASSIUM: 3.1 mmol/L — AB (ref 3.5–5.1)
Sodium: 139 mmol/L (ref 135–145)

## 2014-09-18 LAB — PROTIME-INR
INR: 2.29 — AB (ref 0.00–1.49)
Prothrombin Time: 25.4 seconds — ABNORMAL HIGH (ref 11.6–15.2)

## 2014-09-18 NOTE — Progress Notes (Signed)
[facility]  ROUNDING NOTE   Subjective:   Seen and examined on hemodialysis. Tolerating treatment well.  Wearing Bipap on dialysis. She states that this helps with her anxiety.  Ultrafiltration off  Objective:  Vital signs: T98.1 p 73 r 26 bp 148/69  Physical Exam: General: NAD,   Head: +BiPap  Eyes: Anicteric, PERRL  Neck: Supple, trachea midline  Lungs:  Clear to auscultation  Heart: Regular rate and rhythm  Abdomen:  Soft, nontender,   Extremities: trace peripheral edema.  Neurologic: Alert and oriented, moving all four extremities  Skin: No lesions  Access: RIJ permcath    Basic Metabolic Panel:  Recent Labs Lab 09/13/14 0549 09/16/14 0644 09/18/14 0640  NA 142 139 139  K 3.2* 2.9* 3.1*  CL 105 101 102  CO2 29 28 28   GLUCOSE 75 73 83  BUN 12 22* 13  CREATININE 2.81* 2.94* 2.67*  CALCIUM 8.0* 7.8* 8.1*  PHOS 3.8 4.8* 3.9    Liver Function Tests:  Recent Labs Lab 09/13/14 0549 09/16/14 0644 09/18/14 0640  ALBUMIN 1.5* 1.6* 1.6*   No results for input(s): LIPASE, AMYLASE in the last 168 hours. No results for input(s): AMMONIA in the last 168 hours.  CBC:  Recent Labs Lab 09/13/14 0549 09/16/14 0643 09/18/14 0640  WBC 11.0* 9.9 10.1  HGB 8.4* 7.8* 8.0*  HCT 27.6* 25.5* 26.4*  MCV 87.3 88.2 87.7  PLT 221 216 175    Microbiology: Results for orders placed or performed during the hospital encounter of 08/06/14  Culture, Urine     Status: None   Collection Time: 08/11/14  4:55 PM  Result Value Ref Range Status   Specimen Description URINE, RANDOM  Final   Special Requests NONE  Final   Colony Count   Final    >=100,000 COLONIES/ML Performed at [facility]    Culture   Final    Multiple bacterial morphotypes present, none predominant. Suggest appropriate recollection if clinically indicated. Performed at [facility]    Report Status 08/12/2014 FINAL  Final  Culture, blood (routine x 2)     Status: None    Collection Time: 08/11/14  5:32 PM  Result Value Ref Range Status   Specimen Description BLOOD LEFT HAND  Final   Special Requests BOTTLES DRAWN AEROBIC ONLY [place]  Final   Culture   Final    NO GROWTH 5 DAYS Performed at [facility]    Report Status 08/18/2014 FINAL  Final  Culture, blood (routine x 2)     Status: None   Collection Time: 08/11/14  5:45 PM  Result Value Ref Range Status   Specimen Description BLOOD RIGHT HAND  Final   Special Requests BOTTLES DRAWN AEROBIC ONLY [place]  Final   Culture   Final    NO GROWTH 5 DAYS Performed at [facility]    Report Status 08/18/2014 FINAL  Final  Body fluid culture     Status: None   Collection Time: 08/15/14  2:04 PM  Result Value Ref Range Status   Specimen Description FLUID LEFT PLEURAL  Final   Special Requests NONE  Final   Gram Stain   Final    NO WBC SEEN NO ORGANISMS SEEN Performed at [facility]    Culture   Final    NO GROWTH 3 DAYS Performed at [facility]    Report Status 08/19/2014 FINAL  Final  Culture, routine-abscess     Status: None  Collection Time: 08/19/14 12:01 PM  Result Value Ref Range Status   Specimen Description ABSCESS SPLEEN  Final   Special Requests NONE  Final   Gram Stain   Final    ABUNDANT WBC PRESENT,BOTH PMN AND MONONUCLEAR NO SQUAMOUS EPITHELIAL CELLS SEEN NO ORGANISMS SEEN Performed at [facility]    Culture   Final    NO GROWTH 3 DAYS Performed at [facility]    Report Status 08/22/2014 FINAL  Final  Culture, routine-abscess     Status: None   Collection Time: 08/19/14 12:01 PM  Result Value Ref Range Status   Specimen Description ABSCESS LEFT LOWER ABDOMEN  Final   Special Requests LLQ  Final   Gram Stain   Final    ABUNDANT WBC PRESENT, PREDOMINANTLY PMN NO SQUAMOUS EPITHELIAL CELLS SEEN NO ORGANISMS SEEN Performed at [facility]    Culture   Final    NO GROWTH 3 DAYS Performed at [facility]     Report Status 08/22/2014 FINAL  Final  Clostridium Difficile by PCR     Status: None   Collection Time: 08/31/14 12:40 PM  Result Value Ref Range Status   C difficile by pcr NEGATIVE NEGATIVE Final  Culture, Urine     Status: None   Collection Time: 09/02/14  1:37 PM  Result Value Ref Range Status   Specimen Description URINE, RANDOM  Final   Special Requests NONE  Final   Colony Count   Final    30,000 COLONIES/ML Performed at [facility]    Culture   Final    Multiple bacterial morphotypes present, none predominant. Suggest appropriate recollection if clinically indicated. Performed at [facility]    Report Status 09/03/2014 FINAL  Final     Assessment/ Plan:  Andrea Vega is a  64 y.o. 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   1. ESRD on HD MWF: 24 hour urine collection suggestive of ESRD. CRCL of 5.  - outpatient planning in progress. Patient will need to be off BiPap. Will prescribe citalopram to help with anxiety.  - Patient lives in [place]  2. Anemia of CKD: hgb down to 8, avoiding aranesp due to maligancy.  3. Hypertension. Well controlled. Currently on amlodipine   4. Secondary Hyperparathyroidism: phos 3.9   5. Hypokalemia: may liberalize diet as per patient's request.     LOSJuleen Vega, Andrea Vega 5/11/201612:05 PM

## 2014-09-19 LAB — PROTIME-INR
INR: 2.45 — ABNORMAL HIGH (ref 0.00–1.49)
PROTHROMBIN TIME: 26.8 s — AB (ref 11.6–15.2)

## 2014-09-20 LAB — CBC
HCT: 27.6 % — ABNORMAL LOW (ref 36.0–46.0)
Hemoglobin: 8.1 g/dL — ABNORMAL LOW (ref 12.0–15.0)
MCH: 25.7 pg — ABNORMAL LOW (ref 26.0–34.0)
MCHC: 29.3 g/dL — AB (ref 30.0–36.0)
MCV: 87.6 fL (ref 78.0–100.0)
Platelets: 196 10*3/uL (ref 150–400)
RBC: 3.15 MIL/uL — AB (ref 3.87–5.11)
RDW: 18.3 % — ABNORMAL HIGH (ref 11.5–15.5)
WBC: 10.3 10*3/uL (ref 4.0–10.5)

## 2014-09-20 LAB — RENAL FUNCTION PANEL
ALBUMIN: 1.7 g/dL — AB (ref 3.5–5.0)
ANION GAP: 9 (ref 5–15)
BUN: 10 mg/dL (ref 6–20)
CO2: 26 mmol/L (ref 22–32)
Calcium: 8.2 mg/dL — ABNORMAL LOW (ref 8.9–10.3)
Chloride: 104 mmol/L (ref 101–111)
Creatinine, Ser: 2.64 mg/dL — ABNORMAL HIGH (ref 0.44–1.00)
GFR calc Af Amer: 21 mL/min — ABNORMAL LOW (ref >60)
GFR calc non Af Amer: 18 mL/min — ABNORMAL LOW (ref >60)
Glucose, Bld: 104 mg/dL — ABNORMAL HIGH (ref 65–99)
Phosphorus: 4.2 mg/dL (ref 2.5–4.6)
Potassium: 3.2 mmol/L — ABNORMAL LOW (ref 3.5–5.1)
Sodium: 139 mmol/L (ref 135–145)

## 2014-09-20 LAB — PROTIME-INR
INR: 2.26 — AB (ref 0.00–1.49)
Prothrombin Time: 25.2 seconds — ABNORMAL HIGH (ref 11.6–15.2)

## 2014-09-20 NOTE — Progress Notes (Signed)
Subjective: Pt due for HD today, to have HD while sitting up in chair. Just had PT.    Objective: 97 69 17 142/80 Gen: NAD, laying in bed HEENT: [place]/AT EOMI hearing intact. Neck: supple Lungs: CTAB normal effort CV: S1S2 no rubs Abd: left LQ ostomy, soft NTND Ext: trace b/l LE edema Neuro: A+Ox 3, follows commands Access: RIJ permcath  Lab Results: Results for orders placed or performed during the hospital encounter of 08/06/14 (from the past 24 hour(s))  Protime-INR     Status: Abnormal   Collection Time: 09/20/14  6:30 AM  Result Value Ref Range   Prothrombin Time 25.2 (H) 11.6 - 15.2 seconds   INR 2.26 (H) 0.00 - 1.49    Studies/Results: No results found.     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 hep B core AB and surface Ab positive suggesting prior exposure  1. ESRD on HD MWF: CrCl only 5, pt has ESRD, will continue HD on MWF schedule, due for HD today, orders prepared. 2. Anemia of CKD: hgb 8 at last check, no ESAs due to malignancy. 3. Hypertension. BP 142/80 and acceptable, continue amlodipine. 4. Secondary Hyperparathyroidism:  Phos 3.9 at last check and acceptable. 5.  Hypokalemia:  Recheck K on Monday.  Naara Kelty 09/20/2014,9:31 AM

## 2014-09-21 LAB — PROTIME-INR
INR: 2.34 — ABNORMAL HIGH (ref 0.00–1.49)
Prothrombin Time: 25.8 seconds — ABNORMAL HIGH (ref 11.6–15.2)

## 2014-09-22 LAB — PROTIME-INR
INR: 2.68 — ABNORMAL HIGH (ref 0.00–1.49)
PROTHROMBIN TIME: 28.7 s — AB (ref 11.6–15.2)

## 2014-09-23 LAB — CBC
HCT: 27.9 % — ABNORMAL LOW (ref 36.0–46.0)
HEMATOCRIT: 17.7 % — AB (ref 36.0–46.0)
HEMOGLOBIN: 8.3 g/dL — AB (ref 12.0–15.0)
Hemoglobin: 5.5 g/dL — CL (ref 12.0–15.0)
MCH: 27.4 pg (ref 26.0–34.0)
MCH: 26.1 pg (ref 26.0–34.0)
MCHC: 31.1 g/dL (ref 30.0–36.0)
MCHC: 29.7 g/dL — AB (ref 30.0–36.0)
MCV: 88.1 fL (ref 78.0–100.0)
MCV: 87.7 fL (ref 78.0–100.0)
Platelets: 142 10*3/uL — ABNORMAL LOW (ref 150–400)
Platelets: 214 10*3/uL (ref 150–400)
RBC: 2.01 MIL/uL — AB (ref 3.87–5.11)
RBC: 3.18 MIL/uL — ABNORMAL LOW (ref 3.87–5.11)
RDW: 18.6 % — ABNORMAL HIGH (ref 11.5–15.5)
RDW: 18.4 % — ABNORMAL HIGH (ref 11.5–15.5)
WBC: 7.3 10*3/uL (ref 4.0–10.5)
WBC: 11.2 10*3/uL — ABNORMAL HIGH (ref 4.0–10.5)

## 2014-09-23 LAB — RENAL FUNCTION PANEL
ANION GAP: 11 (ref 5–15)
Albumin: 1.7 g/dL — ABNORMAL LOW (ref 3.5–5.0)
BUN: 15 mg/dL (ref 6–20)
CO2: 24 mmol/L (ref 22–32)
Calcium: 8.3 mg/dL — ABNORMAL LOW (ref 8.9–10.3)
Chloride: 103 mmol/L (ref 101–111)
Creatinine, Ser: 2.69 mg/dL — ABNORMAL HIGH (ref 0.44–1.00)
GFR calc non Af Amer: 18 mL/min — ABNORMAL LOW (ref >60)
GFR, EST AFRICAN AMERICAN: 21 mL/min — AB (ref >60)
GLUCOSE: 79 mg/dL (ref 65–99)
PHOSPHORUS: 5 mg/dL — AB (ref 2.5–4.6)
Potassium: 3.5 mmol/L (ref 3.5–5.1)
SODIUM: 138 mmol/L (ref 135–145)

## 2014-09-23 LAB — PROTIME-INR
INR: 2.73 — AB (ref 0.00–1.49)
PROTHROMBIN TIME: 29.1 s — AB (ref 11.6–15.2)

## 2014-09-23 NOTE — Progress Notes (Signed)
Subjective: Patient completed her dialysis today  4 K bath was used 24 hr CrCl is 5 cc/min  Objective: 96.8  83  22  162/80 Gen: NAD, laying in bed HEENT: hearing intact. Neck: supple Lungs: CTAB normal effort CV: S1S2 no rubs Abd: left LQ ostomy, soft NTND, drain in place Ext: trace b/l LE edema Neuro: A+Ox 3, follows commands Access: RIJ permcath  Lab Results: Results for orders placed or performed during the hospital encounter of 08/06/14 (from the past 24 hour(s))  Protime-INR     Status: Abnormal   Collection Time: 09/23/14  5:00 AM  Result Value Ref Range   Prothrombin Time 29.1 (H) 11.6 - 15.2 seconds   INR 2.73 (H) 0.00 - 1.49  CBC     Status: Abnormal   Collection Time: 09/23/14  7:00 AM  Result Value Ref Range   WBC 7.3 4.0 - 10.5 K/uL   RBC 2.01 (L) 3.87 - 5.11 MIL/uL   Hemoglobin 5.5 (LL) 12.0 - 15.0 g/dL   HCT 17.7 (L) 36.0 - 46.0 %   MCV 88.1 78.0 - 100.0 fL   MCH 27.4 26.0 - 34.0 pg   MCHC 31.1 30.0 - 36.0 g/dL   RDW 18.6 (H) 11.5 - 15.5 %   Platelets 142 (L) 150 - 400 K/uL  Renal function panel     Status: Abnormal   Collection Time: 09/23/14  8:05 AM  Result Value Ref Range   Sodium 138 135 - 145 mmol/L   Potassium 3.5 3.5 - 5.1 mmol/L   Chloride 103 101 - 111 mmol/L   CO2 24 22 - 32 mmol/L   Glucose, Bld 79 65 - 99 mg/dL   BUN 15 6 - 20 mg/dL   Creatinine, Ser 2.69 (H) 0.44 - 1.00 mg/dL   Calcium 8.3 (L) 8.9 - 10.3 mg/dL   Phosphorus 5.0 (H) 2.5 - 4.6 mg/dL   Albumin 1.7 (L) 3.5 - 5.0 g/dL   GFR calc non Af Amer 18 (L) >60 mL/min   GFR calc Af Amer 21 (L) >60 mL/min   Anion gap 11 5 - 15  CBC     Status: Abnormal   Collection Time: 09/23/14  9:15 AM  Result Value Ref Range   WBC 11.2 (H) 4.0 - 10.5 K/uL   RBC 3.18 (L) 3.87 - 5.11 MIL/uL   Hemoglobin 8.3 (L) 12.0 - 15.0 g/dL   HCT 27.9 (L) 36.0 - 46.0 %   MCV 87.7 78.0 - 100.0 fL   MCH 26.1 26.0 - 34.0 pg   MCHC 29.7 (L) 30.0 - 36.0 g/dL   RDW 18.4 (H) 11.5 - 15.5 %   Platelets 214 150 -  400 K/uL    Studies/Results: No results found.     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 hep B core AB and surface Ab positive suggesting prior exposure  1. ESRD on HD MWF: CrCl only 5, pt has ESRD, will continue HD on MWF schedule,  2. Anemia of CKD: hgb 8 at last check, no ESAs due to malignancy. Blood transfusion as necessary 3. Secondary Hyperparathyroidism:  Phos acceptable. Eating better 4  Hypokalemia: resolved. 4 K bath was used today. 5. HTN- change amlodipine to ARB cozaar.  Kamiryn Bezanson 09/23/2014,4:31 PM

## 2014-09-24 ENCOUNTER — Other Ambulatory Visit (HOSPITAL_COMMUNITY): Payer: Self-pay

## 2014-09-24 LAB — PROTIME-INR
INR: 2.63 — ABNORMAL HIGH (ref 0.00–1.49)
Prothrombin Time: 28.3 seconds — ABNORMAL HIGH (ref 11.6–15.2)

## 2014-09-25 ENCOUNTER — Other Ambulatory Visit (HOSPITAL_COMMUNITY): Payer: Self-pay

## 2014-09-25 LAB — RENAL FUNCTION PANEL
Albumin: 1.6 g/dL — ABNORMAL LOW (ref 3.5–5.0)
Anion gap: 7 (ref 5–15)
BUN: 12 mg/dL (ref 6–20)
CHLORIDE: 106 mmol/L (ref 101–111)
CO2: 26 mmol/L (ref 22–32)
CREATININE: 2.16 mg/dL — AB (ref 0.44–1.00)
Calcium: 7.5 mg/dL — ABNORMAL LOW (ref 8.9–10.3)
GFR calc Af Amer: 27 mL/min — ABNORMAL LOW (ref >60)
GFR calc non Af Amer: 23 mL/min — ABNORMAL LOW (ref >60)
Glucose, Bld: 75 mg/dL (ref 65–99)
PHOSPHORUS: 3.7 mg/dL (ref 2.5–4.6)
Potassium: 2.9 mmol/L — ABNORMAL LOW (ref 3.5–5.1)
Sodium: 139 mmol/L (ref 135–145)

## 2014-09-25 LAB — CBC
HEMATOCRIT: 25.5 % — AB (ref 36.0–46.0)
HEMOGLOBIN: 7.7 g/dL — AB (ref 12.0–15.0)
MCH: 26.4 pg (ref 26.0–34.0)
MCHC: 30.2 g/dL (ref 30.0–36.0)
MCV: 87.3 fL (ref 78.0–100.0)
PLATELETS: 160 10*3/uL (ref 150–400)
RBC: 2.92 MIL/uL — AB (ref 3.87–5.11)
RDW: 18.6 % — ABNORMAL HIGH (ref 11.5–15.5)
WBC: 9.8 10*3/uL (ref 4.0–10.5)

## 2014-09-25 LAB — PROTIME-INR
INR: 2.05 — ABNORMAL HIGH (ref 0.00–1.49)
Prothrombin Time: 23.3 seconds — ABNORMAL HIGH (ref 11.6–15.2)

## 2014-09-25 MED ORDER — IOHEXOL 300 MG/ML  SOLN
50.0000 mL | Freq: Once | INTRAMUSCULAR | Status: AC | PRN
  Administered 2014-09-25: 15 mL via INTRAVENOUS

## 2014-09-25 NOTE — Procedures (Signed)
Technically successful fluoroscopic guided exchange and upsizing of percutaneous drain within the left upper abdominal quadrant. Percutaneous drain connected to a JP bulb. No immediate post procedural complications.

## 2014-09-26 LAB — PROTIME-INR
INR: 2 — AB (ref 0.00–1.49)
PROTHROMBIN TIME: 22.9 s — AB (ref 11.6–15.2)

## 2014-09-26 LAB — MAGNESIUM: Magnesium: 1.5 mg/dL — ABNORMAL LOW (ref 1.7–2.4)

## 2014-09-26 LAB — POTASSIUM: Potassium: 3.7 mmol/L (ref 3.5–5.1)

## 2014-09-27 LAB — RENAL FUNCTION PANEL
Albumin: 1.8 g/dL — ABNORMAL LOW (ref 3.5–5.0)
Anion gap: 8 (ref 5–15)
BUN: 12 mg/dL (ref 6–20)
CHLORIDE: 104 mmol/L (ref 101–111)
CO2: 26 mmol/L (ref 22–32)
Calcium: 8.4 mg/dL — ABNORMAL LOW (ref 8.9–10.3)
Creatinine, Ser: 2.13 mg/dL — ABNORMAL HIGH (ref 0.44–1.00)
GFR calc non Af Amer: 24 mL/min — ABNORMAL LOW (ref >60)
GFR, EST AFRICAN AMERICAN: 28 mL/min — AB (ref >60)
Glucose, Bld: 78 mg/dL (ref 65–99)
Phosphorus: 3.8 mg/dL (ref 2.5–4.6)
Potassium: 3.6 mmol/L (ref 3.5–5.1)
Sodium: 138 mmol/L (ref 135–145)

## 2014-09-27 LAB — PROTIME-INR
INR: 1.84 — AB (ref 0.00–1.49)
PROTHROMBIN TIME: 21.2 s — AB (ref 11.6–15.2)

## 2014-09-27 NOTE — Progress Notes (Signed)
Subjective: Scheduled for dialysis later today Eating well Has good UOP per her report  Objective: 96.8  75  16  131/60 Gen: NAD, laying in bed HEENT: hearing intact. Neck: supple Lungs: CTAB normal effort CV: S1S2 no rubs Abd: left LQ ostomy, soft NT ND, d Ext: trace b/l LE edema Neuro: A+Ox 3, follows commands Access: RIJ permcath  Lab Results: Results for orders placed or performed during the hospital encounter of 08/06/14 (from the past 24 hour(s))  Protime-INR     Status: Abnormal   Collection Time: 09/27/14  5:00 AM  Result Value Ref Range   Prothrombin Time 21.2 (H) 11.6 - 15.2 seconds   INR 1.84 (H) 0.00 - 1.49  Renal function panel     Status: Abnormal   Collection Time: 09/27/14  5:00 AM  Result Value Ref Range   Sodium 138 135 - 145 mmol/L   Potassium 3.6 3.5 - 5.1 mmol/L   Chloride 104 101 - 111 mmol/L   CO2 26 22 - 32 mmol/L   Glucose, Bld 78 65 - 99 mg/dL   BUN 12 6 - 20 mg/dL   Creatinine, Ser 2.13 (H) 0.44 - 1.00 mg/dL   Calcium 8.4 (L) 8.9 - 10.3 mg/dL   Phosphorus 3.8 2.5 - 4.6 mg/dL   Albumin 1.8 (L) 3.5 - 5.0 g/dL   GFR calc non Af Amer 24 (L) >60 mL/min   GFR calc Af Amer 28 (L) >60 mL/min   Anion gap 8 5 - 15    Studies/Results: Ir Catheter Tube Change  09/25/2014   CLINICAL DATA:  Patient with history of colon cancer, post surgery performed at an outside institution complicated by postoperative leukocytosis and fever.  CT scanning performed 08/14/2014 demonstrated development of several intra abdominal fluid collections worrisome for abscesses. Patient underwent CT-guided placement of 2 percutaneous drainage catheters on 08/19/2014.  The fluid collection along the left pericolic has resolved, however despite appropriate positioning of percutaneous drainage catheter, a residual fluid collection persists within the left upper abdominal quadrant adjacent to the spleen.  As such, patient presents for fluoroscopic guided percutaneous drainage catheter  manipulation, exchange and up sizing.  EXAM: FLUOROSCOPIC GUIDED PERCUTANEOUS DRAINAGE CATHETER EXCHANGE  COMPARISON:  CT abdomen pelvis - 08/14/2014; 08/27/2014; 09/11/2014; 09/24/2014; CT-guided percutaneous drainage catheter placement - 08/19/2014  CONTRAST:  79mL OMNIPAQUE IOHEXOL 300 MG/ML SOLN - administered via the left upper quadrant percutaneous drainage catheter.  FLUOROSCOPY TIME:  1 minute, 12 seconds (24.2 mGy)  TECHNIQUE: The patient was positioned supine on the fluoroscopy table. The external portion of the existing percutaneous drainage catheter within the left upper abdominal quadrant as well as the surrounding skin were prepped and draped in usual sterile fashion.  A preprocedural spot fluoroscopic image was obtained of the left upper abdominal quadrant existing percutaneous drainage catheter. A small amount of contrast was injected via the existing percutaneous drainage catheter.  External portion of the existing 12 French percutaneous drainage catheter was cut and cannulated with a short Amplatz wire. A new 80 French percutaneous drainage catheter was manipulated towards the more cranial aspect of the left upper abdominal quadrant. Small amount of contrast was injected.  The percutaneous drainage catheter was reconnected to a JP bulb and sutured in place. A dressing was placed. The patient tolerated the procedure well without immediate postprocedural complication.  FINDINGS: Appropriately positioned and functioning percutaneous drainage catheter within the left upper abdominal quadrant.  At successful fluoroscopic guided up sizing and repositioning, the new percutaneous drainage catheter is  coiled and locked within the cranial aspect of the left upper abdominal quadrant.  IMPRESSION: Technically successful fluoroscopic guided exchange, repositioning and up sizing of a now 66 French percutaneous drainage catheter.   Electronically Signed   By: Sandi Mariscal M.D.   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 hep B core AB and surface Ab positive suggesting prior exposure  1. ESRD on HD MWF: CrCl only 5 but some doubt about the accuracy of volume collection - dialysis on hold today - repeat 24 hr CrCl with foley cathter - Decision regarding continuation of dialysis to be made on Monday 2. Anemia of CKD:no ESAs due to malignancy. Blood transfusion as necessary 3. Secondary Hyperparathyroidism:  Phos acceptable. Eating better 4  Hypokalemia: resolved. 4 K bath was used previously. 5. HTN- changed amlodipine to ARB cozaar. BP acceptable  Andrea Vega 09/27/2014,9:22 AM

## 2014-09-28 LAB — RENAL FUNCTION PANEL
ALBUMIN: 1.7 g/dL — AB (ref 3.5–5.0)
ANION GAP: 8 (ref 5–15)
BUN: 16 mg/dL (ref 6–20)
CO2: 28 mmol/L (ref 22–32)
Calcium: 8.1 mg/dL — ABNORMAL LOW (ref 8.9–10.3)
Chloride: 102 mmol/L (ref 101–111)
Creatinine, Ser: 2.37 mg/dL — ABNORMAL HIGH (ref 0.44–1.00)
GFR calc Af Amer: 24 mL/min — ABNORMAL LOW (ref >60)
GFR calc non Af Amer: 21 mL/min — ABNORMAL LOW (ref >60)
Glucose, Bld: 80 mg/dL (ref 65–99)
Phosphorus: 4.8 mg/dL — ABNORMAL HIGH (ref 2.5–4.6)
Potassium: 3.2 mmol/L — ABNORMAL LOW (ref 3.5–5.1)
Sodium: 138 mmol/L (ref 135–145)

## 2014-09-28 LAB — CREATININE CLEARANCE, URINE, 24 HOUR
CREATININE 24H UR: 566 mg/d — AB (ref 600–1800)
Collection Interval-CRCL: 24 hours
Creatinine Clearance: 17 mL/min — ABNORMAL LOW (ref 75–115)
Creatinine, Urine: 26.96 mg/dL
Urine Total Volume-CRCL: 2100 mL

## 2014-09-28 LAB — PROTIME-INR
INR: 1.83 — AB (ref 0.00–1.49)
PROTHROMBIN TIME: 21.1 s — AB (ref 11.6–15.2)

## 2014-09-29 LAB — PROTIME-INR
INR: 2.06 — AB (ref 0.00–1.49)
PROTHROMBIN TIME: 23.1 s — AB (ref 11.6–15.2)

## 2014-09-30 DIAGNOSIS — IMO0002 Reserved for concepts with insufficient information to code with codable children: Secondary | ICD-10-CM | POA: Insufficient documentation

## 2014-09-30 LAB — CBC
HCT: 27.1 % — ABNORMAL LOW (ref 36.0–46.0)
HEMOGLOBIN: 8.3 g/dL — AB (ref 12.0–15.0)
MCH: 26.5 pg (ref 26.0–34.0)
MCHC: 30.6 g/dL (ref 30.0–36.0)
MCV: 86.6 fL (ref 78.0–100.0)
PLATELETS: 257 10*3/uL (ref 150–400)
RBC: 3.13 MIL/uL — ABNORMAL LOW (ref 3.87–5.11)
RDW: 18.9 % — ABNORMAL HIGH (ref 11.5–15.5)
WBC: 10.9 10*3/uL — ABNORMAL HIGH (ref 4.0–10.5)

## 2014-09-30 LAB — RENAL FUNCTION PANEL
Albumin: 2 g/dL — ABNORMAL LOW (ref 3.5–5.0)
Anion gap: 9 (ref 5–15)
BUN: 18 mg/dL (ref 6–20)
CO2: 25 mmol/L (ref 22–32)
Calcium: 8 mg/dL — ABNORMAL LOW (ref 8.9–10.3)
Chloride: 102 mmol/L (ref 101–111)
Creatinine, Ser: 2.74 mg/dL — ABNORMAL HIGH (ref 0.44–1.00)
GFR calc Af Amer: 20 mL/min — ABNORMAL LOW (ref >60)
GFR calc non Af Amer: 18 mL/min — ABNORMAL LOW (ref >60)
Glucose, Bld: 85 mg/dL (ref 65–99)
Phosphorus: 5.3 mg/dL — ABNORMAL HIGH (ref 2.5–4.6)
Potassium: 3.2 mmol/L — ABNORMAL LOW (ref 3.5–5.1)
Sodium: 136 mmol/L (ref 135–145)

## 2014-09-30 LAB — PROTIME-INR
INR: 1.82 — ABNORMAL HIGH (ref 0.00–1.49)
Prothrombin Time: 21 seconds — ABNORMAL HIGH (ref 11.6–15.2)

## 2014-09-30 NOTE — Progress Notes (Signed)
Subjective:  Eating well Has good UOP  24 hr foley collection - Cr Cl 17 cc/min  Objective: 98  72  16  129/55 Gen: NAD, laying in bed HEENT: hearing intact. Neck: supple Lungs: CTAB normal effort CV: S1S2 no rubs Abd: left LQ ostomy, soft NT, Left drain in place Ext: trace b/l LE edema Neuro: A+Ox 3, follows commands Access: RIJ permcath  Lab Results: Results for orders placed or performed during the hospital encounter of 08/06/14 (from the past 24 hour(s))  Protime-INR     Status: Abnormal   Collection Time: 09/30/14  7:00 AM  Result Value Ref Range   Prothrombin Time 21.0 (H) 11.6 - 15.2 seconds   INR 1.82 (H) 0.00 - 1.49  Renal function panel     Status: Abnormal   Collection Time: 09/30/14  7:00 AM  Result Value Ref Range   Sodium 136 135 - 145 mmol/L   Potassium 3.2 (L) 3.5 - 5.1 mmol/L   Chloride 102 101 - 111 mmol/L   CO2 25 22 - 32 mmol/L   Glucose, Bld 85 65 - 99 mg/dL   BUN 18 6 - 20 mg/dL   Creatinine, Ser 2.74 (H) 0.44 - 1.00 mg/dL   Calcium 8.0 (L) 8.9 - 10.3 mg/dL   Phosphorus 5.3 (H) 2.5 - 4.6 mg/dL   Albumin 2.0 (L) 3.5 - 5.0 g/dL   GFR calc non Af Amer 18 (L) >60 mL/min   GFR calc Af Amer 20 (L) >60 mL/min   Anion gap 9 5 - 15  CBC     Status: Abnormal   Collection Time: 09/30/14  7:00 AM  Result Value Ref Range   WBC 10.9 (H) 4.0 - 10.5 K/uL   RBC 3.13 (L) 3.87 - 5.11 MIL/uL   Hemoglobin 8.3 (L) 12.0 - 15.0 g/dL   HCT 27.1 (L) 36.0 - 46.0 %   MCV 86.6 78.0 - 100.0 fL   MCH 26.5 26.0 - 34.0 pg   MCHC 30.6 30.0 - 36.0 g/dL   RDW 18.9 (H) 11.5 - 15.5 %   Platelets 257 150 - 400 K/uL    Studies/Results: No results found.    Results for CHYANN, AMBROCIO (MRN [id number]) as of 09/30/2014 15:18  Ref. 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 hep B core AB and surface Ab positive suggesting prior exposure  1. ARF with CKD st 4: 24 hr urine for cr cl = 17 cc/min - no further need for dialysis is anticipated at present.  - will set up follow up in office.  - Keep permcath for now. Will get it removed as outpatient  2. Anemia of CKD:no ESAs due to malignancy. Blood transfusion as necessary 3. Secondary Hyperparathyroidism:  Phos acceptable. Eating better 4  Hypokalemia: resolved. 4 K bath was used previously. Replace orally prn 5. HTN- changed amlodipine to ARB cozaar. BP acceptable  Laurieann Friddle 09/30/2014,3:16 PM

## 2014-09-30 NOTE — Progress Notes (Signed)
Patient ID: Andrea Vega, female   DOB: 1951-04-28, 64 y.o.   MRN: [id number]     Referring Physician(s): Hijazi  Subjective:  Doing well today. No new complaints.  Allergies: Penicillins  Medications: Prior to Admission medications   Not on File     Vital Signs: BP 137/61 mmHg  Pulse 64  Resp 16  SpO2 91%  Physical Exam  Awake and Alert  Left side drain in place, but Stat-Lock has become loose. Risk for drain becoming dislodged. Some sero-sang fluid in bulb. Output not recorded. Abdomen non-tender   Imaging: No results found.  Labs:  CBC:  Recent Labs  09/23/14 0700 09/23/14 0915 09/25/14 0715 09/30/14 0700  WBC 7.3 11.2* 9.8 10.9*  HGB 5.5* 8.3* 7.7* 8.3*  HCT 17.7* 27.9* 25.5* 27.1*  PLT 142* 214 160 257    COAGS:  Recent Labs  08/01/14 0932  09/27/14 0500 09/28/14 0515 09/29/14 0730 09/30/14 0700  INR 1.3  < > 1.84* 1.83* 2.06* 1.82*  APTT 42.4*  --   --   --   --   --   < > = values in this interval not displayed.  BMP:  Recent Labs  09/25/14 0716 09/26/14 0500 09/27/14 0500 09/28/14 0515 09/30/14 0700  NA 139  --  138 138 136  K 2.9* 3.7 3.6 3.2* 3.2*  CL 106  --  104 102 102  CO2 26  --  26 28 25   GLUCOSE 75  --  78 80 85  BUN 12  --  12 16 18   CALCIUM 7.5*  --  8.4* 8.1* 8.0*  CREATININE 2.16*  --  2.13* 2.37* 2.74*  GFRNONAA 23*  --  24* 21* 18*  GFRAA 27*  --  28* 24* 20*    LIVER FUNCTION TESTS:  Recent Labs  04/30/14 0147  08/07/14 0531 08/10/14 0530  09/01/14 0615  09/25/14 0716 09/27/14 0500 09/28/14 0515 09/30/14 0700  BILITOT  --   --  0.5 0.4  --  0.5  --   --   --   --   --   AST 18  --  19 17  --  13  --   --   --   --   --   ALT 32  --  19 18  --  13  --   --   --   --   --   ALKPHOS 117*  --  142* 139*  --  180*  --   --   --   --   --   PROT 8.1  --  5.6* 5.5*  --  5.2*  --   --   --   --   --   ALBUMIN 3.5  < > 1.9* 1.7*  < > 1.6*  < > 1.6* 1.8* 1.7* 2.0*  < > = values in this interval  not displayed.  Assessment and Plan:  S/P Perc Drainage of fluid collections.   Continue current care Will have nurse replace Stat-Lock  SignedNevin Bloodgood 09/30/2014, 10:34 AM   I spent a total of 15 Minutes in face to face in clinical consultation/evaluation, greater than 50% of which was counseling/coordinating care for perc drain.

## 2014-10-01 LAB — PROTIME-INR
INR: 1.97 — AB (ref 0.00–1.49)
PROTHROMBIN TIME: 22.3 s — AB (ref 11.6–15.2)

## 2014-10-01 LAB — BASIC METABOLIC PANEL
Anion gap: 8 (ref 5–15)
BUN: 19 mg/dL (ref 6–20)
CO2: 24 mmol/L (ref 22–32)
CREATININE: 2.78 mg/dL — AB (ref 0.44–1.00)
Calcium: 8.3 mg/dL — ABNORMAL LOW (ref 8.9–10.3)
Chloride: 104 mmol/L (ref 101–111)
GFR, EST AFRICAN AMERICAN: 20 mL/min — AB (ref >60)
GFR, EST NON AFRICAN AMERICAN: 17 mL/min — AB (ref >60)
GLUCOSE: 85 mg/dL (ref 65–99)
POTASSIUM: 3.6 mmol/L (ref 3.5–5.1)
SODIUM: 136 mmol/L (ref 135–145)

## 2014-10-02 LAB — CBC
HCT: 25 % — ABNORMAL LOW (ref 36.0–46.0)
HEMOGLOBIN: 7.6 g/dL — AB (ref 12.0–15.0)
MCH: 26.4 pg (ref 26.0–34.0)
MCHC: 30.4 g/dL (ref 30.0–36.0)
MCV: 86.8 fL (ref 78.0–100.0)
Platelets: 263 10*3/uL (ref 150–400)
RBC: 2.88 MIL/uL — ABNORMAL LOW (ref 3.87–5.11)
RDW: 18.9 % — ABNORMAL HIGH (ref 11.5–15.5)
WBC: 10.2 10*3/uL (ref 4.0–10.5)

## 2014-10-02 LAB — RENAL FUNCTION PANEL
ALBUMIN: 1.9 g/dL — AB (ref 3.5–5.0)
ANION GAP: 9 (ref 5–15)
BUN: 19 mg/dL (ref 6–20)
CALCIUM: 8.2 mg/dL — AB (ref 8.9–10.3)
CHLORIDE: 102 mmol/L (ref 101–111)
CO2: 26 mmol/L (ref 22–32)
Creatinine, Ser: 2.78 mg/dL — ABNORMAL HIGH (ref 0.44–1.00)
GFR calc non Af Amer: 17 mL/min — ABNORMAL LOW (ref >60)
GFR, EST AFRICAN AMERICAN: 20 mL/min — AB (ref >60)
Glucose, Bld: 86 mg/dL (ref 65–99)
Phosphorus: 4.9 mg/dL — ABNORMAL HIGH (ref 2.5–4.6)
Potassium: 3.4 mmol/L — ABNORMAL LOW (ref 3.5–5.1)
Sodium: 137 mmol/L (ref 135–145)

## 2014-10-02 LAB — PROTIME-INR
INR: 2.32 — AB (ref 0.00–1.49)
Prothrombin Time: 25.2 seconds — ABNORMAL HIGH (ref 11.6–15.2)

## 2014-10-03 ENCOUNTER — Other Ambulatory Visit (HOSPITAL_COMMUNITY): Payer: Self-pay

## 2014-10-03 LAB — PROTIME-INR
INR: 2.12 — ABNORMAL HIGH (ref 0.00–1.49)
PROTHROMBIN TIME: 23.6 s — AB (ref 11.6–15.2)

## 2014-10-03 LAB — POTASSIUM: Potassium: 3.7 mmol/L (ref 3.5–5.1)

## 2014-10-04 ENCOUNTER — Other Ambulatory Visit (HOSPITAL_COMMUNITY): Payer: Self-pay

## 2014-10-04 ENCOUNTER — Other Ambulatory Visit (HOSPITAL_COMMUNITY): Payer: Medicare Other

## 2014-10-04 LAB — PROTIME-INR
INR: 2.23 — ABNORMAL HIGH (ref 0.00–1.49)
Prothrombin Time: 24.5 seconds — ABNORMAL HIGH (ref 11.6–15.2)

## 2014-10-04 MED ORDER — IOHEXOL 300 MG/ML  SOLN
50.0000 mL | Freq: Once | INTRAMUSCULAR | Status: AC | PRN
  Administered 2014-10-04: 10 mL

## 2014-10-08 ENCOUNTER — Other Ambulatory Visit
Admission: RE | Admit: 2014-10-08 | Discharge: 2014-10-08 | Disposition: A | Discharge status: Home / Self Care | Payer: No Typology Code available for payment source | Source: Skilled Nursing Facility | Attending: Family Medicine | Admitting: Family Medicine

## 2014-10-08 DIAGNOSIS — I4891 Unspecified atrial fibrillation: Secondary | ICD-10-CM | POA: Diagnosis present

## 2014-10-08 LAB — PROTIME-INR
INR: 1.3
PROTHROMBIN TIME: 16.4 s — AB (ref 11.4–15.0)

## 2014-10-10 ENCOUNTER — Other Ambulatory Visit (HOSPITAL_COMMUNITY): Payer: Self-pay | Admitting: Interventional Radiology

## 2014-10-10 DIAGNOSIS — L0291 Cutaneous abscess, unspecified: Secondary | ICD-10-CM

## 2014-10-10 DIAGNOSIS — IMO0002 Reserved for concepts with insufficient information to code with codable children: Secondary | ICD-10-CM

## 2014-10-14 ENCOUNTER — Inpatient Hospital Stay: Payer: Medicare Other

## 2014-10-14 ENCOUNTER — Encounter: Payer: Self-pay | Admitting: Internal Medicine

## 2014-10-14 ENCOUNTER — Inpatient Hospital Stay: Payer: Medicare Other | Attending: Internal Medicine | Admitting: Internal Medicine

## 2014-10-14 VITALS — BP 113/71 | HR 83 | Temp 99.4°F | Resp 18 | Ht 66.0 in | Wt 219.8 lb

## 2014-10-14 DIAGNOSIS — C189 Malignant neoplasm of colon, unspecified: Secondary | ICD-10-CM | POA: Diagnosis not present

## 2014-10-14 DIAGNOSIS — R970 Elevated carcinoembryonic antigen [CEA]: Secondary | ICD-10-CM

## 2014-10-14 DIAGNOSIS — R531 Weakness: Secondary | ICD-10-CM | POA: Insufficient documentation

## 2014-10-14 DIAGNOSIS — Z79899 Other long term (current) drug therapy: Secondary | ICD-10-CM | POA: Diagnosis not present

## 2014-10-14 DIAGNOSIS — D649 Anemia, unspecified: Secondary | ICD-10-CM | POA: Diagnosis not present

## 2014-10-14 DIAGNOSIS — R188 Other ascites: Secondary | ICD-10-CM | POA: Insufficient documentation

## 2014-10-14 DIAGNOSIS — I4892 Unspecified atrial flutter: Secondary | ICD-10-CM | POA: Insufficient documentation

## 2014-10-14 DIAGNOSIS — R0609 Other forms of dyspnea: Secondary | ICD-10-CM | POA: Insufficient documentation

## 2014-10-14 DIAGNOSIS — K5900 Constipation, unspecified: Secondary | ICD-10-CM

## 2014-10-14 DIAGNOSIS — D3A8 Other benign neuroendocrine tumors: Secondary | ICD-10-CM | POA: Diagnosis not present

## 2014-10-14 DIAGNOSIS — R160 Hepatomegaly, not elsewhere classified: Secondary | ICD-10-CM | POA: Insufficient documentation

## 2014-10-14 DIAGNOSIS — E119 Type 2 diabetes mellitus without complications: Secondary | ICD-10-CM

## 2014-10-14 DIAGNOSIS — M479 Spondylosis, unspecified: Secondary | ICD-10-CM | POA: Insufficient documentation

## 2014-10-14 DIAGNOSIS — Z801 Family history of malignant neoplasm of trachea, bronchus and lung: Secondary | ICD-10-CM

## 2014-10-14 DIAGNOSIS — K760 Fatty (change of) liver, not elsewhere classified: Secondary | ICD-10-CM | POA: Insufficient documentation

## 2014-10-14 DIAGNOSIS — E279 Disorder of adrenal gland, unspecified: Secondary | ICD-10-CM | POA: Insufficient documentation

## 2014-10-14 DIAGNOSIS — E669 Obesity, unspecified: Secondary | ICD-10-CM | POA: Insufficient documentation

## 2014-10-14 DIAGNOSIS — F17210 Nicotine dependence, cigarettes, uncomplicated: Secondary | ICD-10-CM | POA: Insufficient documentation

## 2014-10-14 DIAGNOSIS — Z800 Family history of malignant neoplasm of digestive organs: Secondary | ICD-10-CM

## 2014-10-14 DIAGNOSIS — K219 Gastro-esophageal reflux disease without esophagitis: Secondary | ICD-10-CM

## 2014-10-14 DIAGNOSIS — I10 Essential (primary) hypertension: Secondary | ICD-10-CM | POA: Insufficient documentation

## 2014-10-14 DIAGNOSIS — M549 Dorsalgia, unspecified: Secondary | ICD-10-CM

## 2014-10-14 LAB — CBC WITH DIFFERENTIAL/PLATELET
BASOS ABS: 0.1 10*3/uL (ref 0–0.1)
BASOS PCT: 1 %
EOS ABS: 0.3 10*3/uL (ref 0–0.7)
Eosinophils Relative: 3 %
HCT: 29.8 % — ABNORMAL LOW (ref 35.0–47.0)
HEMOGLOBIN: 9.4 g/dL — AB (ref 12.0–16.0)
LYMPHS ABS: 1.2 10*3/uL (ref 1.0–3.6)
LYMPHS PCT: 9 %
MCH: 27.1 pg (ref 26.0–34.0)
MCHC: 31.4 g/dL — ABNORMAL LOW (ref 32.0–36.0)
MCV: 86.2 fL (ref 80.0–100.0)
Monocytes Absolute: 1.1 10*3/uL — ABNORMAL HIGH (ref 0.2–0.9)
Monocytes Relative: 9 %
Neutro Abs: 9.7 10*3/uL — ABNORMAL HIGH (ref 1.4–6.5)
Neutrophils Relative %: 78 %
Platelets: 308 10*3/uL (ref 150–440)
RBC: 3.45 MIL/uL — ABNORMAL LOW (ref 3.80–5.20)
RDW: 19.9 % — ABNORMAL HIGH (ref 11.5–14.5)
WBC: 12.3 10*3/uL — AB (ref 3.6–11.0)

## 2014-10-14 LAB — COMPREHENSIVE METABOLIC PANEL
ALBUMIN: 2.8 g/dL — AB (ref 3.5–5.0)
ALK PHOS: 174 U/L — AB (ref 38–126)
ALT: 12 U/L — ABNORMAL LOW (ref 14–54)
ANION GAP: 9 (ref 5–15)
AST: 23 U/L (ref 15–41)
BILIRUBIN TOTAL: 0.4 mg/dL (ref 0.3–1.2)
BUN: 24 mg/dL — ABNORMAL HIGH (ref 6–20)
CALCIUM: 8.7 mg/dL — AB (ref 8.9–10.3)
CO2: 25 mmol/L (ref 22–32)
Chloride: 100 mmol/L — ABNORMAL LOW (ref 101–111)
Creatinine, Ser: 2.43 mg/dL — ABNORMAL HIGH (ref 0.44–1.00)
GFR calc Af Amer: 23 mL/min — ABNORMAL LOW (ref >60)
GFR calc non Af Amer: 20 mL/min — ABNORMAL LOW (ref >60)
Glucose, Bld: 97 mg/dL (ref 65–99)
Potassium: 3.8 mmol/L (ref 3.5–5.1)
Sodium: 134 mmol/L — ABNORMAL LOW (ref 135–145)
TOTAL PROTEIN: 7.3 g/dL (ref 6.5–8.1)

## 2014-10-14 LAB — APTT: aPTT: 33 seconds (ref 24–36)

## 2014-10-14 LAB — PROTIME-INR
INR: 1.24
Prothrombin Time: 15.8 seconds — ABNORMAL HIGH (ref 11.4–15.0)

## 2014-10-15 ENCOUNTER — Ambulatory Visit (HOSPITAL_COMMUNITY)
Admission: RE | Admit: 2014-10-15 | Discharge: 2014-10-15 | Disposition: A | Discharge status: Home / Self Care | Payer: Medicare Other | Source: Ambulatory Visit | Attending: Interventional Radiology | Admitting: Interventional Radiology

## 2014-10-15 DIAGNOSIS — N281 Cyst of kidney, acquired: Secondary | ICD-10-CM | POA: Insufficient documentation

## 2014-10-15 DIAGNOSIS — K651 Peritoneal abscess: Secondary | ICD-10-CM | POA: Insufficient documentation

## 2014-10-15 DIAGNOSIS — Z933 Colostomy status: Secondary | ICD-10-CM | POA: Diagnosis not present

## 2014-10-15 DIAGNOSIS — J90 Pleural effusion, not elsewhere classified: Secondary | ICD-10-CM | POA: Insufficient documentation

## 2014-10-15 DIAGNOSIS — E279 Disorder of adrenal gland, unspecified: Secondary | ICD-10-CM | POA: Diagnosis not present

## 2014-10-15 DIAGNOSIS — IMO0002 Reserved for concepts with insufficient information to code with codable children: Secondary | ICD-10-CM

## 2014-10-15 DIAGNOSIS — J9811 Atelectasis: Secondary | ICD-10-CM | POA: Diagnosis not present

## 2014-10-15 LAB — CEA: CEA: 14.2 ng/mL — ABNORMAL HIGH (ref 0.0–4.7)

## 2014-10-15 NOTE — Progress Notes (Signed)
[place] clinic follow up  Pt is 64 year old female, SP left upper quadrant perc drainage of perisplenic abscess.  Drain placed 08/19/14. Mx follow up with CT & fluoro has shown residual small collection at the drain.   Today the patient presents with accidental removal of the drain.  She denies any fevers, rigors, or chills.  Max temperature reported is 99 -100.0.  CT shows small collection persists at the site of prior drain.  Mild inflammatory changes/edema persists.  Continues on ABX  No indication for replacement of the drain at this time.  Patient will follow up with her nephrology physician and [facility] surgical care after DC from current rehab facility.  Can return for VIR eval on prn basis.   Patient understands plan of care.   Call with questions/concerns. Signed,  Dulcy Fanny. Earleen Newport, DO

## 2014-10-15 NOTE — Progress Notes (Signed)
Called daughter tiarna koppen and she states that her and her mother spoke and the pt does not want a port in.  She already is still on atb for the dialysis catheter was placed and she has been through so many infections and does not want another source of infection. The daughter says the patient would like the oral med that dr pandit offered.  I told her that the CEA is elevated on patient  And therefore she needs to have pet scan and it is sch. For 6/8 at 12:15 and daughter is ok with that and she is on the way to [facility] to discuss mother''s care with staff.  Also she has appt with pandit 6/10 at 8:30 .  The daughter wants me to make sure what time her mother has appt with nephrology.  Also because pt does not want port the daughter is agreeable for me to call and cancel appt for surgeon tom. I called [facility] and spoke to Neoma Laming and worked out transportation for pet scan and for [employer] and I called [facility] and spoke to [place] and cancelled the port and preop appt..  I also told this info to [facility]. Faxed to [facility] the special instructions for PET.

## 2014-10-16 ENCOUNTER — Telehealth: Payer: Self-pay | Admitting: Surgery

## 2014-10-16 ENCOUNTER — Other Ambulatory Visit: Payer: Medicare Other

## 2014-10-16 ENCOUNTER — Ambulatory Visit: Payer: Self-pay | Admitting: Surgery

## 2014-10-16 ENCOUNTER — Ambulatory Visit
Admission: RE | Admit: 2014-10-16 | Discharge: 2014-10-16 | Disposition: A | Discharge status: Home / Self Care | Payer: Medicare Other | Source: Ambulatory Visit | Attending: Internal Medicine | Admitting: Internal Medicine

## 2014-10-16 DIAGNOSIS — I10 Essential (primary) hypertension: Secondary | ICD-10-CM | POA: Diagnosis present

## 2014-10-16 DIAGNOSIS — I4892 Unspecified atrial flutter: Secondary | ICD-10-CM | POA: Diagnosis present

## 2014-10-16 DIAGNOSIS — Z85038 Personal history of other malignant neoplasm of large intestine: Secondary | ICD-10-CM

## 2014-10-16 DIAGNOSIS — J9811 Atelectasis: Secondary | ICD-10-CM | POA: Insufficient documentation

## 2014-10-16 DIAGNOSIS — J90 Pleural effusion, not elsewhere classified: Secondary | ICD-10-CM | POA: Insufficient documentation

## 2014-10-16 DIAGNOSIS — C189 Malignant neoplasm of colon, unspecified: Secondary | ICD-10-CM

## 2014-10-16 DIAGNOSIS — D3502 Benign neoplasm of left adrenal gland: Secondary | ICD-10-CM | POA: Insufficient documentation

## 2014-10-16 DIAGNOSIS — L03319 Cellulitis of trunk, unspecified: Secondary | ICD-10-CM | POA: Diagnosis not present

## 2014-10-16 DIAGNOSIS — Z79899 Other long term (current) drug therapy: Secondary | ICD-10-CM

## 2014-10-16 DIAGNOSIS — K219 Gastro-esophageal reflux disease without esophagitis: Secondary | ICD-10-CM | POA: Diagnosis present

## 2014-10-16 DIAGNOSIS — E119 Type 2 diabetes mellitus without complications: Secondary | ICD-10-CM | POA: Diagnosis present

## 2014-10-16 DIAGNOSIS — Z7901 Long term (current) use of anticoagulants: Secondary | ICD-10-CM

## 2014-10-16 DIAGNOSIS — L02211 Cutaneous abscess of abdominal wall: Principal | ICD-10-CM | POA: Diagnosis present

## 2014-10-16 DIAGNOSIS — Z933 Colostomy status: Secondary | ICD-10-CM

## 2014-10-16 DIAGNOSIS — E669 Obesity, unspecified: Secondary | ICD-10-CM | POA: Diagnosis present

## 2014-10-16 DIAGNOSIS — Z6834 Body mass index (BMI) 34.0-34.9, adult: Secondary | ICD-10-CM

## 2014-10-16 DIAGNOSIS — Z9049 Acquired absence of other specified parts of digestive tract: Secondary | ICD-10-CM | POA: Diagnosis present

## 2014-10-16 DIAGNOSIS — L03311 Cellulitis of abdominal wall: Secondary | ICD-10-CM | POA: Diagnosis present

## 2014-10-16 DIAGNOSIS — F329 Major depressive disorder, single episode, unspecified: Secondary | ICD-10-CM | POA: Diagnosis present

## 2014-10-16 DIAGNOSIS — Z880 Allergy status to penicillin: Secondary | ICD-10-CM

## 2014-10-16 DIAGNOSIS — Z79891 Long term (current) use of opiate analgesic: Secondary | ICD-10-CM

## 2014-10-16 DIAGNOSIS — Z87891 Personal history of nicotine dependence: Secondary | ICD-10-CM

## 2014-10-16 LAB — GLUCOSE, CAPILLARY: GLUCOSE-CAPILLARY: 82 mg/dL (ref 65–99)

## 2014-10-16 MED ORDER — FLUDEOXYGLUCOSE F - 18 (FDG) INJECTION
12.2800 | Freq: Once | INTRAVENOUS | Status: AC | PRN
  Administered 2014-10-16: 12.28 via INTRAVENOUS

## 2014-10-16 NOTE — Telephone Encounter (Signed)
Pt has decided to not have a port placement for chemo. The nurse Butch Penny from Dr Pandit's office has called and stated that the patient has choose the oral Tx. Dr Leanora Cover has been notified. Pre op and sx has been canceled.

## 2014-10-17 ENCOUNTER — Ambulatory Visit: Admit: 2014-10-17 | Payer: Self-pay | Admitting: Surgery

## 2014-10-17 SURGERY — INSERTION, TUNNELED CENTRAL VENOUS DEVICE, WITH PORT
Anesthesia: Choice

## 2014-10-18 ENCOUNTER — Inpatient Hospital Stay
Admission: EM | Admit: 2014-10-18 | Discharge: 2014-10-23 | DRG: 580 | Disposition: A | Discharge status: Skilled Nursing Facility | Payer: Medicare Other | Attending: Surgery | Admitting: Surgery

## 2014-10-18 ENCOUNTER — Encounter: Payer: Self-pay | Admitting: *Deleted

## 2014-10-18 ENCOUNTER — Inpatient Hospital Stay (HOSPITAL_BASED_OUTPATIENT_CLINIC_OR_DEPARTMENT_OTHER): Payer: Medicare Other | Admitting: Internal Medicine

## 2014-10-18 ENCOUNTER — Emergency Department: Payer: Medicare Other

## 2014-10-18 ENCOUNTER — Telehealth: Payer: Self-pay | Admitting: Surgery

## 2014-10-18 ENCOUNTER — Telehealth: Payer: Self-pay | Admitting: *Deleted

## 2014-10-18 VITALS — BP 124/78 | HR 84 | Temp 98.3°F | Resp 18 | Ht 66.0 in

## 2014-10-18 DIAGNOSIS — C189 Malignant neoplasm of colon, unspecified: Secondary | ICD-10-CM | POA: Diagnosis not present

## 2014-10-18 DIAGNOSIS — L02211 Cutaneous abscess of abdominal wall: Secondary | ICD-10-CM | POA: Diagnosis present

## 2014-10-18 DIAGNOSIS — E119 Type 2 diabetes mellitus without complications: Secondary | ICD-10-CM | POA: Diagnosis present

## 2014-10-18 DIAGNOSIS — K760 Fatty (change of) liver, not elsewhere classified: Secondary | ICD-10-CM

## 2014-10-18 DIAGNOSIS — R188 Other ascites: Secondary | ICD-10-CM

## 2014-10-18 DIAGNOSIS — D649 Anemia, unspecified: Secondary | ICD-10-CM

## 2014-10-18 DIAGNOSIS — L03319 Cellulitis of trunk, unspecified: Secondary | ICD-10-CM

## 2014-10-18 DIAGNOSIS — Z7901 Long term (current) use of anticoagulants: Secondary | ICD-10-CM | POA: Diagnosis not present

## 2014-10-18 DIAGNOSIS — R0609 Other forms of dyspnea: Secondary | ICD-10-CM

## 2014-10-18 DIAGNOSIS — I10 Essential (primary) hypertension: Secondary | ICD-10-CM | POA: Diagnosis present

## 2014-10-18 DIAGNOSIS — L0291 Cutaneous abscess, unspecified: Secondary | ICD-10-CM | POA: Diagnosis present

## 2014-10-18 DIAGNOSIS — Z880 Allergy status to penicillin: Secondary | ICD-10-CM | POA: Diagnosis not present

## 2014-10-18 DIAGNOSIS — K219 Gastro-esophageal reflux disease without esophagitis: Secondary | ICD-10-CM

## 2014-10-18 DIAGNOSIS — Z87891 Personal history of nicotine dependence: Secondary | ICD-10-CM | POA: Diagnosis not present

## 2014-10-18 DIAGNOSIS — E669 Obesity, unspecified: Secondary | ICD-10-CM | POA: Diagnosis present

## 2014-10-18 DIAGNOSIS — Z800 Family history of malignant neoplasm of digestive organs: Secondary | ICD-10-CM

## 2014-10-18 DIAGNOSIS — K5900 Constipation, unspecified: Secondary | ICD-10-CM

## 2014-10-18 DIAGNOSIS — Z9049 Acquired absence of other specified parts of digestive tract: Secondary | ICD-10-CM | POA: Diagnosis present

## 2014-10-18 DIAGNOSIS — Z801 Family history of malignant neoplasm of trachea, bronchus and lung: Secondary | ICD-10-CM

## 2014-10-18 DIAGNOSIS — Z79899 Other long term (current) drug therapy: Secondary | ICD-10-CM | POA: Diagnosis not present

## 2014-10-18 DIAGNOSIS — D3A8 Other benign neuroendocrine tumors: Secondary | ICD-10-CM | POA: Diagnosis not present

## 2014-10-18 DIAGNOSIS — R531 Weakness: Secondary | ICD-10-CM

## 2014-10-18 DIAGNOSIS — Z85038 Personal history of other malignant neoplasm of large intestine: Secondary | ICD-10-CM | POA: Diagnosis not present

## 2014-10-18 DIAGNOSIS — Z79891 Long term (current) use of opiate analgesic: Secondary | ICD-10-CM | POA: Diagnosis not present

## 2014-10-18 DIAGNOSIS — I4892 Unspecified atrial flutter: Secondary | ICD-10-CM | POA: Diagnosis present

## 2014-10-18 DIAGNOSIS — L03311 Cellulitis of abdominal wall: Secondary | ICD-10-CM | POA: Diagnosis present

## 2014-10-18 DIAGNOSIS — R970 Elevated carcinoembryonic antigen [CEA]: Secondary | ICD-10-CM

## 2014-10-18 DIAGNOSIS — F17210 Nicotine dependence, cigarettes, uncomplicated: Secondary | ICD-10-CM

## 2014-10-18 DIAGNOSIS — R160 Hepatomegaly, not elsewhere classified: Secondary | ICD-10-CM

## 2014-10-18 DIAGNOSIS — E279 Disorder of adrenal gland, unspecified: Secondary | ICD-10-CM

## 2014-10-18 DIAGNOSIS — L0390 Cellulitis, unspecified: Secondary | ICD-10-CM

## 2014-10-18 DIAGNOSIS — L02219 Cutaneous abscess of trunk, unspecified: Secondary | ICD-10-CM

## 2014-10-18 DIAGNOSIS — M479 Spondylosis, unspecified: Secondary | ICD-10-CM

## 2014-10-18 DIAGNOSIS — F329 Major depressive disorder, single episode, unspecified: Secondary | ICD-10-CM | POA: Diagnosis present

## 2014-10-18 DIAGNOSIS — Z933 Colostomy status: Secondary | ICD-10-CM | POA: Diagnosis not present

## 2014-10-18 DIAGNOSIS — Z6834 Body mass index (BMI) 34.0-34.9, adult: Secondary | ICD-10-CM | POA: Diagnosis not present

## 2014-10-18 LAB — CBC WITH DIFFERENTIAL/PLATELET
Basophils Absolute: 0.1 10*3/uL (ref 0–0.1)
Basophils Relative: 0 %
Eosinophils Absolute: 0.3 10*3/uL (ref 0–0.7)
Eosinophils Relative: 2 %
HEMATOCRIT: 27.2 % — AB (ref 35.0–47.0)
Hemoglobin: 8.7 g/dL — ABNORMAL LOW (ref 12.0–16.0)
Lymphocytes Relative: 14 %
Lymphs Abs: 1.7 10*3/uL (ref 1.0–3.6)
MCH: 27.9 pg (ref 26.0–34.0)
MCHC: 32.1 g/dL (ref 32.0–36.0)
MCV: 87 fL (ref 80.0–100.0)
Monocytes Absolute: 1.3 10*3/uL — ABNORMAL HIGH (ref 0.2–0.9)
Monocytes Relative: 10 %
Neutro Abs: 9.1 10*3/uL — ABNORMAL HIGH (ref 1.4–6.5)
Neutrophils Relative %: 74 %
Platelets: 319 10*3/uL (ref 150–440)
RBC: 3.12 MIL/uL — AB (ref 3.80–5.20)
RDW: 20.1 % — ABNORMAL HIGH (ref 11.5–14.5)
WBC: 12.4 10*3/uL — ABNORMAL HIGH (ref 3.6–11.0)

## 2014-10-18 LAB — COMPREHENSIVE METABOLIC PANEL
ALBUMIN: 2.7 g/dL — AB (ref 3.5–5.0)
ALT: 12 U/L — AB (ref 14–54)
ANION GAP: 10 (ref 5–15)
AST: 23 U/L (ref 15–41)
Alkaline Phosphatase: 185 U/L — ABNORMAL HIGH (ref 38–126)
BILIRUBIN TOTAL: 0.4 mg/dL (ref 0.3–1.2)
BUN: 25 mg/dL — ABNORMAL HIGH (ref 6–20)
CO2: 23 mmol/L (ref 22–32)
Calcium: 8.5 mg/dL — ABNORMAL LOW (ref 8.9–10.3)
Chloride: 102 mmol/L (ref 101–111)
Creatinine, Ser: 2.34 mg/dL — ABNORMAL HIGH (ref 0.44–1.00)
GFR calc Af Amer: 25 mL/min — ABNORMAL LOW (ref >60)
GFR calc non Af Amer: 21 mL/min — ABNORMAL LOW (ref >60)
Glucose, Bld: 93 mg/dL (ref 65–99)
Potassium: 3.7 mmol/L (ref 3.5–5.1)
Sodium: 135 mmol/L (ref 135–145)
TOTAL PROTEIN: 7.2 g/dL (ref 6.5–8.1)

## 2014-10-18 MED ORDER — AMIODARONE HCL 100 MG PO TABS
100.0000 mg | ORAL_TABLET | Freq: Every day | ORAL | Status: DC
  Administered 2014-10-20 – 2014-10-23 (×3): 100 mg via ORAL
  Filled 2014-10-18 (×6): qty 1

## 2014-10-18 MED ORDER — SERTRALINE HCL 100 MG PO TABS
100.0000 mg | ORAL_TABLET | Freq: Every day | ORAL | Status: DC
  Administered 2014-10-19 – 2014-10-23 (×5): 100 mg via ORAL
  Filled 2014-10-18 (×5): qty 1

## 2014-10-18 MED ORDER — MORPHINE SULFATE 2 MG/ML IJ SOLN
2.0000 mg | INTRAMUSCULAR | Status: DC | PRN
  Administered 2014-10-18: 4 mg via INTRAVENOUS
  Administered 2014-10-19: 2 mg via INTRAVENOUS
  Administered 2014-10-19: 4 mg via INTRAVENOUS
  Administered 2014-10-19: 2 mg via INTRAVENOUS
  Administered 2014-10-19: 4 mg via INTRAVENOUS
  Administered 2014-10-20 – 2014-10-22 (×6): 2 mg via INTRAVENOUS
  Filled 2014-10-18: qty 2
  Filled 2014-10-18 (×5): qty 1
  Filled 2014-10-18 (×2): qty 2
  Filled 2014-10-18 (×3): qty 1

## 2014-10-18 MED ORDER — NICOTINE 7 MG/24HR TD PT24
7.0000 mg | MEDICATED_PATCH | Freq: Every day | TRANSDERMAL | Status: DC
  Administered 2014-10-19 – 2014-10-23 (×5): 7 mg via TRANSDERMAL
  Filled 2014-10-18 (×5): qty 1

## 2014-10-18 MED ORDER — ACETAMINOPHEN 325 MG RE SUPP: 650.0000 mg | Freq: Four times a day (QID) | RECTAL | Status: DC | PRN

## 2014-10-18 MED ORDER — MIRTAZAPINE 15 MG PO TBDP
7.5000 mg | ORAL_TABLET | Freq: Every day | ORAL | Status: DC
Start: 2014-10-18 — End: 2014-10-23
  Administered 2014-10-19: 7.5 mg via ORAL
  Administered 2014-10-20: 15 mg via ORAL
  Administered 2014-10-21 – 2014-10-22 (×2): 7.5 mg via ORAL
  Filled 2014-10-18: qty 1
  Filled 2014-10-18: qty 0.5
  Filled 2014-10-18 (×2): qty 1
  Filled 2014-10-18: qty 0.5

## 2014-10-18 MED ORDER — ACETAMINOPHEN 325 MG PO TABS: 650.0000 mg | ORAL_TABLET | Freq: Four times a day (QID) | ORAL | Status: DC | PRN

## 2014-10-18 MED ORDER — ONDANSETRON HCL 4 MG/2ML IJ SOLN
4.0000 mg | Freq: Four times a day (QID) | INTRAMUSCULAR | Status: DC | PRN
  Administered 2014-10-18 – 2014-10-22 (×7): 4 mg via INTRAVENOUS
  Filled 2014-10-18 (×7): qty 2

## 2014-10-18 MED ORDER — HEPARIN SODIUM (PORCINE) 5000 UNIT/ML IJ SOLN
5000.0000 [IU] | Freq: Three times a day (TID) | INTRAMUSCULAR | Status: DC
  Administered 2014-10-19 – 2014-10-23 (×13): 5000 [IU] via SUBCUTANEOUS
  Filled 2014-10-18 (×13): qty 1

## 2014-10-18 MED ORDER — FAMOTIDINE 20 MG PO TABS
20.0000 mg | ORAL_TABLET | Freq: Two times a day (BID) | ORAL | Status: DC
  Administered 2014-10-19 – 2014-10-21 (×5): 20 mg via ORAL
  Filled 2014-10-18 (×5): qty 1

## 2014-10-18 MED ORDER — IOHEXOL 240 MG/ML SOLN
25.0000 mL | Freq: Once | INTRAMUSCULAR | Status: AC | PRN
  Administered 2014-10-18: 50 mL via ORAL
  Filled 2014-10-18: qty 25

## 2014-10-18 MED ORDER — CLINDAMYCIN PHOSPHATE 600 MG/50ML IV SOLN
600.0000 mg | Freq: Three times a day (TID) | INTRAVENOUS | Status: DC
  Administered 2014-10-18 – 2014-10-23 (×14): 600 mg via INTRAVENOUS
  Filled 2014-10-18 (×17): qty 50

## 2014-10-18 MED ORDER — METOCLOPRAMIDE HCL 10 MG PO TABS
10.0000 mg | ORAL_TABLET | Freq: Four times a day (QID) | ORAL | Status: DC
Start: 2014-10-18 — End: 2014-10-23
  Administered 2014-10-19 – 2014-10-23 (×16): 10 mg via ORAL
  Filled 2014-10-18 (×16): qty 1

## 2014-10-18 MED ORDER — CLINDAMYCIN PHOSPHATE 600 MG/50ML IV SOLN
INTRAVENOUS | Status: AC
  Administered 2014-10-19: 600 mg via INTRAVENOUS
  Filled 2014-10-18: qty 50

## 2014-10-18 MED ORDER — SODIUM CHLORIDE 0.9 % IV SOLN
INTRAVENOUS | Status: DC
  Administered 2014-10-18 – 2014-10-19 (×2): via INTRAVENOUS
  Administered 2014-10-19: 1000 mL via INTRAVENOUS
  Administered 2014-10-20: 21:00:00 via INTRAVENOUS
  Administered 2014-10-20: 1000 mL via INTRAVENOUS
  Administered 2014-10-21 – 2014-10-23 (×5): via INTRAVENOUS

## 2014-10-18 NOTE — ED Notes (Signed)
Pt assisted with bed pan

## 2014-10-18 NOTE — H&P (Signed)
Surgery History and Physical  CC: Left flank swelling, tenderness, drainage x 1 day  HPI: Ms. Gras is a pleasant 64 yo F s/p colectomy with colostomy and drainage of perisplenic abscess who presents with 1 day of swelling, tenderness, erythema and purulent drainage from prior JP drain site.  Was doing well before this.  No fevers/chills, night sweats, shortness of breath, cough, chest pain, nausea/vomiting, diarrhea/constipation, dysuria/hematuria.  Active Ambulatory Problems    Diagnosis Date Noted  . Atrial flutter   . Obesity   . Diabetes mellitus   . GERD (gastroesophageal reflux disease)   . HTN (hypertension)   . Abscess, abdomen    Resolved Ambulatory Problems    Diagnosis Date Noted  . No Resolved Ambulatory Problems   No Additional Past Medical History   Colon cancer s/p left hemicolectomy and drainage of perisplenic abscess 07/10/2104    Medication List    ASK your doctor about these medications        Acidophilus 100 MG Caps  Take 100 capsules by mouth daily.     amiodarone 100 MG tablet  Commonly known as:  PACERONE  Take 100 mg by mouth daily.     b complex-vitamin c-folic acid 0.8 MG Tabs tablet  Take 1 tablet by mouth at bedtime.     famotidine 20 MG tablet  Commonly known as:  PEPCID  Take 20 mg by mouth 2 (two) times daily.     levofloxacin 500 MG tablet  Commonly known as:  LEVAQUIN  Take 500 mg by mouth every other day.     losartan 100 MG tablet  Commonly known as:  COZAAR  Take 100 mg by mouth daily.     metoCLOPramide 10 MG tablet  Commonly known as:  REGLAN  Take 10 mg by mouth 4 (four) times daily.     metroNIDAZOLE 500 MG tablet  Commonly known as:  FLAGYL  Take 500 mg by mouth 3 (three) times daily.     mirtazapine 15 MG disintegrating tablet  Commonly known as:  REMERON SOL-TAB  Take 7.5 mg by mouth at bedtime.     nicotine 7 mg/24hr patch  Commonly known as:  NICODERM CQ - dosed in mg/24 hr  Place 7 mg onto the skin daily.      OxyCODONE HCl (Abuse Deter) 5 MG Taba  Commonly known as:  OXAYDO  Take 5 mg by mouth every 8 (eight) hours.     sertraline 100 MG tablet  Commonly known as:  ZOLOFT  Take 100 mg by mouth daily.     simethicone 80 MG chewable tablet  Commonly known as:  MYLICON  Chew 80 mg by mouth every 6 (six) hours as needed for flatulence.     warfarin 3 MG tablet  Commonly known as:  COUMADIN  Take 3 mg by mouth daily.     warfarin 4 MG tablet  Commonly known as:  COUMADIN  Take 4 mg by mouth daily.       Allergies  Allergen Reactions  . Penicillins Hives   History   Social History  . Marital Status: Widowed    Spouse Name: N/A  . Number of Children: N/A  . Years of Education: N/A   Occupational History  . Not on file.   Social History Main Topics  . Smoking status: Former Smoker    Quit date: 07/10/2014  . Smokeless tobacco: Not on file  . Alcohol Use: No  . Drug Use: Not on file  .  Sexual Activity: Not on file   Other Topics Concern  . Not on file   Social History Narrative   ROS: Full ROS obtained, pertinent positives and negatives as above  Blood pressure 117/54, pulse 77, temperature 99.4 F (37.4 C), temperature source Oral, resp. rate 20, height 5\' 6"  (1.676 m), weight 96.616 kg (213 lb), SpO2 95 %. GEN: NAD/A&Ox3 FACE: no obvious facial trauma, normal external nose, normal external ears EYES: no scleral icterus, no conjunctivitis HEAD: normocephalic atraumatic CV: RRR, no MRG RESP: moving air well, lungs clear ABD: soft, nontender, nondistended, left posterior flank with significant erythema and induration with purulence from prior JP site EXT: moving all ext well, strength 5/5  LABS: personally reviewed, significant for Cr 2.34 (baseline) WBC 12.4, 74% N  CT: personally reviewed, significant for induration without obvious abscess in left flank but may have some purulence noted in drain tract  A/P 64 yo F with left flank  erythema/induration/purulence at site of prior JP drain. Will admit for IV abx and put on schedule for possible I and D per discretion of Dr. Leanora Cover, daytime surgicalist.  I have explained this and she agrees with this plan.   NEURO: cnII-XII grossly intact, sensation intact all 4 ext

## 2014-10-18 NOTE — Telephone Encounter (Signed)
Deferred to surgery

## 2014-10-18 NOTE — ED Notes (Signed)
Lives at [facility], states jp drain came out last wek, and today staff noticed small abcess around site, noted some drainage from incison site and small blister

## 2014-10-18 NOTE — ED Notes (Signed)
Report to josh, rn.

## 2014-10-18 NOTE — ED Notes (Signed)
Patient transported to CT 

## 2014-10-18 NOTE — ED Notes (Signed)
One set of blood cultures sent. Patient started drinking oral contrast.

## 2014-10-18 NOTE — Progress Notes (Signed)
Called daughter [name] per pandit request and spoke to her about the results of PET and it did show something lighting up around surgery area and around spleen. She does have fluid collection around spleen that has been there and has not changed.  Dr. Ma Hillock spoke to radiologist that read report to discuss what he feels that the spots are and he is not sure.  His thought would be probably from surgery but can not exclude cancer.  The spots are too little to bx and too deep to bx even if they were bigger.  The chemo that he would give for colon cancer she would not be able to take 1 of the main drugs (oxaliplatin) due to her kidney function.  The other option is xeloda and its side effects is diarrhea, sores in the mouth, hand and foot syndrome, dec. Blood tests results. Dr. Ma Hillock said that if she takes xeloda it has about 8% chance of working. If the spots on PET scan turn out to be cancer then she would be stage IV and incurable.  After speaking to pt she opted for no treatment and watch and wait. He stated that he will see her back  In 2 months re do CEA and if it is not decreasing then will order at ct scan to see what status is at that point.  Daughter agreed with pt decision and feels like plan is ok.

## 2014-10-18 NOTE — Telephone Encounter (Signed)
Ivin Booty from [facility] called and said pts. JT tube came out a couple of weeks ago and the site is red, raised, and hurts to touch. Please advise.

## 2014-10-18 NOTE — ED Provider Notes (Signed)
CSN: [id number]     Arrival date & time 10/18/14  1736 History   First MD Initiated Contact with Patient 10/18/14 1801     Chief Complaint  Patient presents with  . Abscess    resident of [facility], states her jp drain came out last week, drain inserted after co0lostomy 4-6 wks ago     (Consider location/radiation/quality/duration/timing/severity/associated sxs/prior Treatment) HPI 64 year old female presents for evaluation of left flank abscess. On 08/19/2014 patient had a drain placed for parasplenic abscess. On 10/15/2014 she had accidental removal, CT showed small fluid accumulation. Patient has had 2 days of drainage to the left flank at the drain site. She describes increased erythema and pain around this area. Pain is 8 out of 10 around the drain site. She denies any abdominal pain or fevers. She denies any nausea or vomiting.   Past Medical History  Diagnosis Date  . Atrial flutter     a. post op and in the setting of sepsis; b. not on long term anticoagulation; c. echo 07/2014: EF 55-60%, technically difficult study  . Obesity   . Diabetes mellitus   . GERD (gastroesophageal reflux disease)   . HTN (hypertension)    Past Surgical History  Procedure Laterality Date  . Colon surgery    . Cholecystectomy     No family history on file. History  Substance Use Topics  . Smoking status: Former Smoker    Quit date: 07/10/2014  . Smokeless tobacco: Not on file  . Alcohol Use: No   OB History    No data available     Review of Systems  Constitutional: Negative for fever, chills, activity change and fatigue.  HENT: Negative for congestion, sinus pressure and sore throat.   Eyes: Negative for visual disturbance.  Respiratory: Negative for cough, chest tightness and shortness of breath.   Cardiovascular: Negative for chest pain and leg swelling.  Gastrointestinal: Negative for nausea, vomiting, abdominal pain and diarrhea.  Genitourinary: Positive for flank pain.  Negative for dysuria.  Musculoskeletal: Negative for arthralgias and gait problem.  Skin: Positive for color change and wound.  Neurological: Negative for weakness, numbness and headaches.  Hematological: Negative for adenopathy.  Psychiatric/Behavioral: Negative for behavioral problems, confusion and agitation.      Allergies  Penicillins  Home Medications   Prior to Admission medications   Medication Sig Start Date End Date Taking? Authorizing Provider  amiodarone (PACERONE) 100 MG tablet Take 100 mg by mouth daily.    Historical Provider, MD  b complex-vitamin c-folic acid (NEPHRO-VITE) 0.8 MG TABS tablet Take 1 tablet by mouth at bedtime.    Historical Provider, MD  famotidine (PEPCID) 20 MG tablet Take 20 mg by mouth 2 (two) times daily.    Historical Provider, MD  Lactobacillus (ACIDOPHILUS) 100 MG CAPS Take 100 capsules by mouth daily.    Historical Provider, MD  levofloxacin (LEVAQUIN) 500 MG tablet Take 500 mg by mouth every other day.    Historical Provider, MD  losartan (COZAAR) 100 MG tablet Take 100 mg by mouth daily.    Historical Provider, MD  metoCLOPramide (REGLAN) 10 MG tablet Take 10 mg by mouth 4 (four) times daily.    Historical Provider, MD  metroNIDAZOLE (FLAGYL) 500 MG tablet Take 500 mg by mouth 3 (three) times daily.    Historical Provider, MD  mirtazapine (REMERON SOL-TAB) 15 MG disintegrating tablet Take 7.5 mg by mouth at bedtime.    Historical Provider, MD  nicotine (NICODERM CQ - DOSED  IN MG/24 HR) 7 mg/24hr patch Place 7 mg onto the skin daily.    Historical Provider, MD  OxyCODONE HCl, Abuse Deter, (OXAYDO) 5 MG TABA Take 5 mg by mouth every 8 (eight) hours.    Historical Provider, MD  sertraline (ZOLOFT) 100 MG tablet Take 100 mg by mouth daily.    Historical Provider, MD  simethicone (MYLICON) 80 MG chewable tablet Chew 80 mg by mouth every 6 (six) hours as needed for flatulence.    Historical Provider, MD  warfarin (COUMADIN) 3 MG tablet Take 3 mg by  mouth daily.    Historical Provider, MD  warfarin (COUMADIN) 4 MG tablet Take 4 mg by mouth daily.    Historical Provider, MD   BP 117/54 mmHg  Pulse 77  Temp(Src) 99.4 F (37.4 C) (Oral)  Resp 20  Ht 5\' 6"  (1.676 m)  Wt 213 lb (96.616 kg)  BMI 34.40 kg/m2  SpO2 95% Physical Exam  Constitutional: She is oriented to person, place, and time. She appears well-developed and well-nourished. No distress.  HENT:  Head: Normocephalic and atraumatic.  Mouth/Throat: Oropharynx is clear and moist.  Eyes: EOM are normal. Pupils are equal, round, and reactive to light. Right eye exhibits no discharge. Left eye exhibits no discharge.  Neck: Normal range of motion. Neck supple.  Cardiovascular: Normal rate, regular rhythm and intact distal pulses.   Pulmonary/Chest: Effort normal and breath sounds normal. No respiratory distress. She exhibits no tenderness.  Abdominal: Soft. She exhibits no distension and no mass. There is no tenderness. There is no guarding.  Colostomy intact with no erythema.  Musculoskeletal: Normal range of motion. She exhibits no edema.  Neurological: She is alert and oriented to person, place, and time. She has normal reflexes.  Skin: Skin is warm and dry. There is erythema.  Left flank along the posterior mid axillary line shows 6 x 6 cm area of erythema with centralized abscess that is approximately about 2 cm with purulent drainage. Area is tender to palpation.  Psychiatric: She has a normal mood and affect. Her behavior is normal. Thought content normal.    ED Course  Procedures (including critical care time) Labs Review Labs Reviewed  CBC WITH DIFFERENTIAL/PLATELET - Abnormal; Notable for the following:    WBC 12.4 (*)    RBC 3.12 (*)    Hemoglobin 8.7 (*)    HCT 27.2 (*)    RDW 20.1 (*)    Neutro Abs 9.1 (*)    Monocytes Absolute 1.3 (*)    All other components within normal limits  COMPREHENSIVE METABOLIC PANEL - Abnormal; Notable for the following:    BUN  25 (*)    Creatinine, Ser 2.34 (*)    Calcium 8.5 (*)    Albumin 2.7 (*)    ALT 12 (*)    Alkaline Phosphatase 185 (*)    GFR calc non Af Amer 21 (*)    GFR calc Af Amer 25 (*)    All other components within normal limits  CULTURE, BLOOD (ROUTINE X 2)  CULTURE, BLOOD (ROUTINE X 2)  PROTIME-INR  COMPREHENSIVE METABOLIC PANEL  CBC    Imaging Review Ct Abdomen Pelvis Wo Contrast  10/18/2014   CLINICAL DATA:  Abscess with drainage from incision site following JP drain removal last week. Colon cancer. Subsequent encounter.  EXAM: CT ABDOMEN AND PELVIS WITHOUT CONTRAST  TECHNIQUE: Multidetector CT imaging of the abdomen and pelvis was performed following the standard protocol without IV contrast.  COMPARISON:  PET-CT  10/16/2014.  Abdominal CT 10/15/2014.  FINDINGS: Lower chest: Stable moderate size dependent left pleural effusion with associated left lower lobe atelectasis. There is no significant pleural fluid on the right. Emphysematous changes are present in both lung bases. Dialysis catheter tips in the upper right atrium.  Hepatobiliary: As evaluated in the noncontrast state, stable without focal abnormality. No evidence of biliary dilatation status post cholecystectomy.  Pancreas: Unremarkable. No pancreatic ductal dilatation or surrounding inflammatory changes.  Spleen: Grossly stable posterior perisplenic fluid collection, measuring 6.0 x 2.4 cm on image 22. The spleen is normal in size without definite intraparenchymal abnormality.  Adrenals/Urinary Tract: Stable nonspecific 2.4 cm left adrenal nodule. The right adrenal gland appears normal.The kidneys appear stable with a cyst in the right interpolar region measuring 2.1 cm. There is asymmetric perinephric structure stranding on the left. No urinary tract calculus or hydronephrosis identified. The bladder is nearly empty without apparent abnormality.  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 There are postsurgical  changes within the anterior abdominal wall. No soft tissue abscess identified. There is no evidence of bowel obstruction or focal bowel wall thickening. There is mild mesenteric edema without focal extraluminal fluid collection.  Vascular/Lymphatic: Small retroperitoneal lymph nodes are stable. There is stable scattered atherosclerosis of the aorta, its branches and the iliac arteries.  Reproductive: Unremarkable.  Other: None.  Musculoskeletal: No acute or significant osseous findings. Stable mild degenerative changes throughout the lumbar spine. There are advanced asymmetric right hip degenerative changes.  IMPRESSION: 1. No evidence of abdominal wall abscess. The postsurgical changes in the anterior abdominal wall and transverse colostomy appear unchanged. 2. Stable perisplenic fluid collection and asymmetric soft tissue stranding throughout the left abdomen. No new or enlarging intra-abdominal fluid collections demonstrated. 3. Stable left pleural effusion and left basilar atelectasis. 4. No evidence of metastatic disease on noncontrast imaging.   Electronically Signed   By: Richardean Sale M.D.   On: 10/18/2014 20:58     EKG Interpretation None      MDM   Final diagnoses:  Cellulitis and abscess of trunk    1. Surgery was consulted. Patient will be admitted to the hospital for further treatment and observation 2. Patient placed nothing by mouth     Duanne Guess, PA-C 10/18/14 2147  Harvest Dark, MD 10/18/14 2325

## 2014-10-19 ENCOUNTER — Inpatient Hospital Stay: Admit: 2014-10-19 | Payer: Self-pay | Admitting: Surgery

## 2014-10-19 ENCOUNTER — Inpatient Hospital Stay: Payer: Medicare Other | Admitting: Anesthesiology

## 2014-10-19 ENCOUNTER — Encounter
Admission: EM | Disposition: A | Discharge status: Skilled Nursing Facility | Payer: Self-pay | Source: Home / Self Care | Attending: Surgery

## 2014-10-19 ENCOUNTER — Encounter: Payer: Self-pay | Admitting: Anesthesiology

## 2014-10-19 DIAGNOSIS — L02211 Cutaneous abscess of abdominal wall: Secondary | ICD-10-CM

## 2014-10-19 HISTORY — PX: WOUND DEBRIDEMENT: SHX247

## 2014-10-19 LAB — CBC
HCT: 24.1 % — ABNORMAL LOW (ref 35.0–47.0)
HEMOGLOBIN: 7.8 g/dL — AB (ref 12.0–16.0)
MCH: 27.9 pg (ref 26.0–34.0)
MCHC: 32.3 g/dL (ref 32.0–36.0)
MCV: 86.3 fL (ref 80.0–100.0)
Platelets: 280 10*3/uL (ref 150–440)
RBC: 2.8 MIL/uL — ABNORMAL LOW (ref 3.80–5.20)
RDW: 19.6 % — ABNORMAL HIGH (ref 11.5–14.5)
WBC: 10.2 10*3/uL (ref 3.6–11.0)

## 2014-10-19 LAB — COMPREHENSIVE METABOLIC PANEL
ALBUMIN: 2.2 g/dL — AB (ref 3.5–5.0)
ALT: 10 U/L — ABNORMAL LOW (ref 14–54)
AST: 21 U/L (ref 15–41)
Alkaline Phosphatase: 160 U/L — ABNORMAL HIGH (ref 38–126)
Anion gap: 8 (ref 5–15)
BILIRUBIN TOTAL: 0.4 mg/dL (ref 0.3–1.2)
BUN: 23 mg/dL — ABNORMAL HIGH (ref 6–20)
CALCIUM: 7.9 mg/dL — AB (ref 8.9–10.3)
CO2: 23 mmol/L (ref 22–32)
Chloride: 105 mmol/L (ref 101–111)
Creatinine, Ser: 2.22 mg/dL — ABNORMAL HIGH (ref 0.44–1.00)
GFR calc Af Amer: 26 mL/min — ABNORMAL LOW (ref >60)
GFR, EST NON AFRICAN AMERICAN: 23 mL/min — AB (ref >60)
GLUCOSE: 99 mg/dL (ref 65–99)
Potassium: 3.7 mmol/L (ref 3.5–5.1)
Sodium: 136 mmol/L (ref 135–145)
Total Protein: 5.7 g/dL — ABNORMAL LOW (ref 6.5–8.1)

## 2014-10-19 LAB — GLUCOSE, CAPILLARY
GLUCOSE-CAPILLARY: 105 mg/dL — AB (ref 65–99)
Glucose-Capillary: 138 mg/dL — ABNORMAL HIGH (ref 65–99)
Glucose-Capillary: 94 mg/dL (ref 65–99)
Glucose-Capillary: 90 mg/dL (ref 65–99)

## 2014-10-19 LAB — PROTIME-INR
INR: 1.52
Prothrombin Time: 18.5 seconds — ABNORMAL HIGH (ref 11.4–15.0)

## 2014-10-19 SURGERY — IRRIGATION AND DEBRIDEMENT ABDOMEN

## 2014-10-19 SURGERY — DEBRIDEMENT, WOUND, ABDOMEN
Anesthesia: General | Site: Abdomen | Laterality: Left | Wound class: Clean Contaminated

## 2014-10-19 MED ORDER — FENTANYL CITRATE (PF) 100 MCG/2ML IJ SOLN
INTRAMUSCULAR | Status: AC
  Filled 2014-10-19: qty 2

## 2014-10-19 MED ORDER — LIDOCAINE HCL (CARDIAC) 20 MG/ML IV SOLN
INTRAVENOUS | Status: DC | PRN
  Administered 2014-10-19: 50 mg via INTRAVENOUS

## 2014-10-19 MED ORDER — FENTANYL CITRATE (PF) 100 MCG/2ML IJ SOLN
25.0000 ug | INTRAMUSCULAR | Status: DC | PRN
  Administered 2014-10-19 (×4): 25 ug via INTRAVENOUS

## 2014-10-19 MED ORDER — ROCURONIUM BROMIDE 100 MG/10ML IV SOLN
INTRAVENOUS | Status: DC | PRN
  Administered 2014-10-19: 20 mg via INTRAVENOUS

## 2014-10-19 MED ORDER — ONDANSETRON HCL 4 MG/2ML IJ SOLN: 4.0000 mg | Freq: Once | INTRAMUSCULAR | Status: AC | PRN

## 2014-10-19 MED ORDER — SUCCINYLCHOLINE CHLORIDE 20 MG/ML IJ SOLN
INTRAMUSCULAR | Status: DC | PRN
  Administered 2014-10-19: 80 mg via INTRAVENOUS

## 2014-10-19 MED ORDER — FIRST-DUKES MOUTHWASH MT SUSP: 5.0000 mL | Freq: Four times a day (QID) | OROMUCOSAL | Status: DC

## 2014-10-19 MED ORDER — MAGIC MOUTHWASH
5.0000 mL | Freq: Four times a day (QID) | ORAL | Status: DC
  Administered 2014-10-19 – 2014-10-23 (×15): 5 mL via ORAL
  Filled 2014-10-19 (×15): qty 5

## 2014-10-19 MED ORDER — MIDAZOLAM HCL 2 MG/2ML IJ SOLN
INTRAMUSCULAR | Status: DC | PRN
  Administered 2014-10-19: 2 mg via INTRAVENOUS

## 2014-10-19 MED ORDER — PROPOFOL 10 MG/ML IV BOLUS
INTRAVENOUS | Status: DC | PRN
  Administered 2014-10-19: 100 mg via INTRAVENOUS

## 2014-10-19 MED ORDER — FENTANYL CITRATE (PF) 100 MCG/2ML IJ SOLN
INTRAMUSCULAR | Status: DC | PRN
  Administered 2014-10-19 (×2): 50 ug via INTRAVENOUS

## 2014-10-19 MED ORDER — SUGAMMADEX SODIUM 200 MG/2ML IV SOLN
INTRAVENOUS | Status: DC | PRN
  Administered 2014-10-19: 196.8 mg via INTRAVENOUS

## 2014-10-19 MED ORDER — SODIUM CHLORIDE 0.9 % IV SOLN
INTRAVENOUS | Status: DC | PRN
  Administered 2014-10-19: 11:00:00 via INTRAVENOUS

## 2014-10-19 SURGICAL SUPPLY — 24 items
CANISTER SUCT 1200ML W/VALVE (MISCELLANEOUS) ×3 IMPLANT
CHLORAPREP W/TINT 26ML (MISCELLANEOUS) ×3 IMPLANT
DRAPE INCISE IOBAN 66X45 STRL (DRAPES) ×3 IMPLANT
DRSG TELFA 4X8 ISLAND PHMB (GAUZE/BANDAGES/DRESSINGS) ×3 IMPLANT
ELECT CAUTERY BLADE 6.4 (BLADE) ×3 IMPLANT
GAUZE SPONGE 4X4 12PLY STRL (GAUZE/BANDAGES/DRESSINGS) ×3 IMPLANT
GLOVE BIO SURGEON STRL SZ7.5 (GLOVE) ×3 IMPLANT
GOWN STRL REUS W/ TWL LRG LVL3 (GOWN DISPOSABLE) ×2 IMPLANT
GOWN STRL REUS W/TWL LRG LVL3 (GOWN DISPOSABLE) ×4
KIT RM TURNOVER STRD PROC AR (KITS) ×3 IMPLANT
LABEL OR SOLS (LABEL) ×3 IMPLANT
NS IRRIG 500ML POUR BTL (IV SOLUTION) ×3 IMPLANT
PACK BASIN MINOR ARMC (MISCELLANEOUS) ×3 IMPLANT
PACK UNIVERSAL (MISCELLANEOUS) ×3 IMPLANT
PAD ABD DERMACEA PRESS 5X9 (GAUZE/BANDAGES/DRESSINGS) ×3 IMPLANT
PAD GROUND ADULT SPLIT (MISCELLANEOUS) ×3 IMPLANT
STAPLER SKIN PROX 35W (STAPLE) ×3 IMPLANT
SUT MNCRL 3-0 UNDYED SH (SUTURE) ×2 IMPLANT
SUT MONOCRYL 3-0 UNDYED (SUTURE) ×4
SUT PROLENE 0 CT 2 (SUTURE) ×3 IMPLANT
SUT PROLENE 1 CT 1 30 (SUTURE) ×3 IMPLANT
SUT VICRYL+ 3-0 36IN CT-1 (SUTURE) ×3 IMPLANT
SWABSTK COMLB BENZOIN TINCTURE (MISCELLANEOUS) ×3 IMPLANT
SYRINGE 10CC LL (SYRINGE) ×3 IMPLANT

## 2014-10-19 NOTE — Progress Notes (Signed)
I have reviewed the patient's data, met with her, and examined her left flank. She has agreed to surgical drainage of her Jackson-Pratt site abscess.

## 2014-10-19 NOTE — Transfer of Care (Signed)
Immediate Anesthesia Transfer of Care Note  Patient: Andrea Vega  Procedure(s) Performed: Procedure(s): DEBRIDEMENT ABDOMINAL WOUND (Left)  Patient Location: PACU  Anesthesia Type:General  Level of Consciousness: Alert, Awake, Oriented  Airway & Oxygen Therapy: Patient Spontanous Breathing  Post-op Assessment: Report given to RN  Post vital signs: Reviewed and stable  Last Vitals:  Filed Vitals:   10/19/14 1148  BP: 121/65  Pulse: 89  Temp: 37.2 C  Resp: 16    Complications: No apparent anesthesia complications

## 2014-10-19 NOTE — Anesthesia Preprocedure Evaluation (Addendum)
Anesthesia Evaluation  Patient identified by MRN, date of birth, ID band Patient awake    Reviewed: Allergy & Precautions, NPO status , Patient's Chart, lab work & pertinent test results  Airway Mallampati: II  TM Distance: >3 FB     Dental  (+) Edentulous Upper, Edentulous Lower   Pulmonary former smoker,          Cardiovascular hypertension, Pt. on medications     Neuro/Psych PSYCHIATRIC DISORDERS Depression    GI/Hepatic GERD-  ,  Endo/Other  diabetes  Renal/GU      Musculoskeletal   Abdominal   Peds  Hematology   Anesthesia Other Findings Only a few teeth.  Reproductive/Obstetrics                            Anesthesia Physical Anesthesia Plan  ASA: III  Anesthesia Plan: General   Post-op Pain Management:    Induction:   Airway Management Planned: Oral ETT  Additional Equipment:   Intra-op Plan:   Post-operative Plan:   Informed Consent: I have reviewed the patients History and Physical, chart, labs and discussed the procedure including the risks, benefits and alternatives for the proposed anesthesia with the patient or authorized representative who has indicated his/her understanding and acceptance.     Plan Discussed with: CRNA  Anesthesia Plan Comments:        Anesthesia Quick Evaluation

## 2014-10-19 NOTE — Anesthesia Procedure Notes (Signed)
Procedure Name: Intubation Date/Time: 10/19/2014 11:02 AM Performed by: Eliberto Ivory Pre-anesthesia Checklist: Patient identified, Patient being monitored, Timeout performed, Emergency Drugs available and Suction available Patient Re-evaluated:Patient Re-evaluated prior to inductionOxygen Delivery Method: Circle system utilized Preoxygenation: Pre-oxygenation with 100% oxygen Intubation Type: IV induction Ventilation: Mask ventilation without difficulty Laryngoscope Size: Mac and 3 Grade View: Grade I Tube type: Oral Tube size: 7.5 mm Number of attempts: 1 Placement Confirmation: ETT inserted through vocal cords under direct vision,  positive ETCO2 and breath sounds checked- equal and bilateral Secured at: 21 cm Tube secured with: Tape Dental Injury: Teeth and Oropharynx as per pre-operative assessment

## 2014-10-19 NOTE — Anesthesia Postprocedure Evaluation (Signed)
  Anesthesia Post-op Note  Patient: Andrea Vega  Procedure(s) Performed: Procedure(s): DEBRIDEMENT ABDOMINAL WOUND (Left)  Anesthesia type:General  Patient location: PACU  Post pain: Pain level controlled  Post assessment: Post-op Vital signs reviewed, Patient's Cardiovascular Status Stable, Respiratory Function Stable, Patent Airway and No signs of Nausea or vomiting  Post vital signs: Reviewed and stable  Last Vitals:  Filed Vitals:   10/19/14 1236  BP: 111/60  Pulse: 74  Temp:   Resp: 13    Level of consciousness: awake, alert  and patient cooperative  Complications: No apparent anesthesia complications

## 2014-10-19 NOTE — Op Note (Signed)
Operative Note  Preoperative Diagnosis: Left flank drain tract abscess  Postoperative Diagnosis: Same  Operation Performed: Left flank drain tract mass  Surgeon: Laverle Patter., M.D.   Assistant: None  Anesthesia: General Endotracheal  Date of Procedure: 10/19/2014   Procedure in Detail:  The risks (including the possibility of adjacent organ injury, the necessity of converting to an open procedure, and the risk of postoperative infection / abscess), potential benefits, non-surgical treatment options, and expected outcomes were reviewed with the patient. The patient concurred with the proposed plan and agreed to proceed, giving informed consent.   Prior to the induction of anesthesia, antibiotic prophylaxis was administered. The patient was placed supine on the OR table and prepped and draped in the usual sterile fashion.   An elliptical skin incision was made that was 6 cm from point to point and 2 cm wide and surrounding the area of the drain tract that was pointing laterally. This was carried down through the subcutaneous tissue in an elliptical fashion around the drain tract for a depth of approximately 6 cm. Abdominal wall musculature was encountered deep and partially excised as well. There was a small amount of pus that emanated and no significant cellulitis, and the entire drain tract was quite indurated. This essentially created a mass effect that was either inflammatory or carcinomatosis; I was not sure which. Hemostasis was excellent and the entire wound was packed with normal saline soaked Kerlix sponge with a dry dressing on top.  Findings: As above          Specimens: 6 x 2 x 6 cm left flank drain tract mass, including skin, subcutaneous tissue, and muscle.           Complications: None; the patient tolerated the procedure well.   Consuela Mimes, MD 10/19/2014

## 2014-10-20 LAB — GLUCOSE, CAPILLARY
GLUCOSE-CAPILLARY: 104 mg/dL — AB (ref 65–99)
GLUCOSE-CAPILLARY: 93 mg/dL (ref 65–99)
Glucose-Capillary: 93 mg/dL (ref 65–99)
Glucose-Capillary: 88 mg/dL (ref 65–99)
Glucose-Capillary: 108 mg/dL — ABNORMAL HIGH (ref 65–99)

## 2014-10-20 NOTE — Progress Notes (Signed)
Called Dr. Rexene Edison @ 2220 on 6/11 to ask for a bipap order per patient request.  Patient usually sleeps with bipap machine at night.  Repiratory therapy needs an order to give bipap to patient.  Doctor put in order.  Christene Slates  10/20/2014  1:42 AM

## 2014-10-20 NOTE — Progress Notes (Signed)
1 Day Post-Op   Subjective:  Only complaining of soreness.  Vital signs in last 24 hours: Temp:  [97.8 F (36.6 C)-98.9 F (37.2 C)] 98.2 F (36.8 C) (06/12 0745) Pulse Rate:  [70-94] 80 (06/12 0745) Resp:  [11-21] 19 (06/12 0745) BP: (106-159)/(49-108) 149/108 mmHg (06/12 0745) SpO2:  [87 %-100 %] 100 % (06/12 0745) Last BM Date: 10/19/14  Intake/Output from previous day: 06/11 0701 - 06/12 0700 In: 3564 [P.O.:580; I.V.:2884; IV Piggyback:100] Out: 1060 [Urine:1050; Stool:10]   Lab Results:  CBC  Recent Labs  10/18/14 1854 10/19/14 0441  WBC 12.4* 10.2  HGB 8.7* 7.8*  HCT 27.2* 24.1*  PLT 319 280   CMP     Component Value Date/Time   NA 136 10/19/2014 0441   NA 139 08/05/2014 0417   K 3.7 10/19/2014 0441   K 4.4 08/05/2014 0417   CL 105 10/19/2014 0441   CL 106 08/05/2014 0417   CO2 23 10/19/2014 0441   CO2 20* 08/05/2014 0417   GLUCOSE 99 10/19/2014 0441   GLUCOSE 81 08/05/2014 0417   BUN 23* 10/19/2014 0441   BUN 46* 08/05/2014 0417   CREATININE 2.22* 10/19/2014 0441   CREATININE 6.42* 08/05/2014 0417   CALCIUM 7.9* 10/19/2014 0441   CALCIUM 8.4* 08/05/2014 0417   PROT 5.7* 10/19/2014 0441   PROT 8.1 04/30/2014 0147   ALBUMIN 2.2* 10/19/2014 0441   ALBUMIN 2.2* 08/05/2014 0417   AST 21 10/19/2014 0441   AST 18 04/30/2014 0147   ALT 10* 10/19/2014 0441   ALT 32 04/30/2014 0147   ALKPHOS 160* 10/19/2014 0441   ALKPHOS 117* 04/30/2014 0147   BILITOT 0.4 10/19/2014 0441   GFRNONAA 23* 10/19/2014 0441   GFRNONAA 6* 08/05/2014 0417   GFRAA 26* 10/19/2014 0441   GFRAA 7* 08/05/2014 0417   PT/INR  Recent Labs  10/19/14 0441  LABPROT 18.5*  INR 1.52    Studies/Results: Ct Abdomen Pelvis Wo Contrast  10/18/2014   CLINICAL DATA:  Abscess with drainage from incision site following JP drain removal last week. Colon cancer. Subsequent encounter.  EXAM: CT ABDOMEN AND PELVIS WITHOUT CONTRAST  TECHNIQUE: Multidetector CT imaging of the abdomen and  pelvis was performed following the standard protocol without IV contrast.  COMPARISON:  PET-CT 10/16/2014.  Abdominal CT 10/15/2014.  FINDINGS: Lower chest: Stable moderate size dependent left pleural effusion with associated left lower lobe atelectasis. There is no significant pleural fluid on the right. Emphysematous changes are present in both lung bases. Dialysis catheter tips in the upper right atrium.  Hepatobiliary: As evaluated in the noncontrast state, stable without focal abnormality. No evidence of biliary dilatation status post cholecystectomy.  Pancreas: Unremarkable. No pancreatic ductal dilatation or surrounding inflammatory changes.  Spleen: Grossly stable posterior perisplenic fluid collection, measuring 6.0 x 2.4 cm on image 22. The spleen is normal in size without definite intraparenchymal abnormality.  Adrenals/Urinary Tract: Stable nonspecific 2.4 cm left adrenal nodule. The right adrenal gland appears normal.The kidneys appear stable with a cyst in the right interpolar region measuring 2.1 cm. There is asymmetric perinephric structure stranding on the left. No urinary tract calculus or hydronephrosis identified. The bladder is nearly empty without apparent abnormality.  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 There are postsurgical changes within the anterior abdominal wall. No soft tissue abscess identified. There is no evidence of bowel obstruction or focal bowel wall thickening. There is mild mesenteric edema without focal extraluminal fluid collection.  Vascular/Lymphatic: Small retroperitoneal lymph nodes are  stable. There is stable scattered atherosclerosis of the aorta, its branches and the iliac arteries.  Reproductive: Unremarkable.  Other: None.  Musculoskeletal: No acute or significant osseous findings. Stable mild degenerative changes throughout the lumbar spine. There are advanced asymmetric right hip degenerative changes.  IMPRESSION: 1. No evidence of  abdominal wall abscess. The postsurgical changes in the anterior abdominal wall and transverse colostomy appear unchanged. 2. Stable perisplenic fluid collection and asymmetric soft tissue stranding throughout the left abdomen. No new or enlarging intra-abdominal fluid collections demonstrated. 3. Stable left pleural effusion and left basilar atelectasis. 4. No evidence of metastatic disease on noncontrast imaging.   Electronically Signed   By: Richardean Sale M.D.   On: 10/18/2014 20:58    Assessment/Plan: Doing well. Pathology pending. No changes.

## 2014-10-21 ENCOUNTER — Encounter: Payer: Self-pay | Admitting: Surgery

## 2014-10-21 LAB — GLUCOSE, CAPILLARY
GLUCOSE-CAPILLARY: 87 mg/dL (ref 65–99)
GLUCOSE-CAPILLARY: 88 mg/dL (ref 65–99)
Glucose-Capillary: 90 mg/dL (ref 65–99)
Glucose-Capillary: 77 mg/dL (ref 65–99)

## 2014-10-21 MED ORDER — FAMOTIDINE 20 MG PO TABS
20.0000 mg | ORAL_TABLET | Freq: Every day | ORAL | Status: DC
  Administered 2014-10-22 – 2014-10-23 (×2): 20 mg via ORAL
  Filled 2014-10-21 (×2): qty 1

## 2014-10-21 NOTE — Care Management Note (Signed)
Case Management Note  Patient Details  Name: RIELLY BRUNN MRN: [id number] Date of Birth: 12-Feb-1951  Subjective/Objective:         Possible discharge back to [facility] Tuesday or Wednesday. Still receiving IV antibiotics.            Action/Plan:   Expected Discharge Date:                  Expected Discharge Plan:     In-House Referral:     Discharge planning Services     Post Acute Care Choice:    Choice offered to:     DME Arranged:    DME Agency:     HH Arranged:    [place] Agency:     Status of Service:     Medicare Important Message Given:    Date Medicare IM Given:    Medicare IM give by:    Date Additional Medicare IM Given:    Additional Medicare Important Message give by:     If discussed at [place] of Stay Meetings, dates discussed:    Additional Comments:  Tedd Cottrill A, RN 10/21/2014, 3:25 PM

## 2014-10-21 NOTE — Progress Notes (Signed)
2 Days Post-Op  Subjective: Minimal minimal pain no fevers or chills resting be unchanged.  Objective: Vital signs in last 24 hours: Temp:  [97.8 F (36.6 C)-98.9 F (37.2 C)] 97.8 F (36.6 C) (06/13 0749) Pulse Rate:  [75-86] 77 (06/13 0749) Resp:  [17-20] 18 (06/13 0749) BP: (129-165)/(59-84) 140/60 mmHg (06/13 0749) SpO2:  [91 %-98 %] 91 % (06/13 0749) Last BM Date: 10/20/14  Intake/Output from previous day: 06/12 0701 - 06/13 0700 In: 2923.9 [P.O.:800; I.V.:2123.9] Out: 1350 [Urine:1350] Intake/Output this shift: Total I/O In: 240 [P.O.:240] Out: 0   Physical exam:  Morbidly obese ostomy is functional. Dressing in place packing removed and inspected minimal erythema no pertinent ones.  Lab Results: CBC   Recent Labs  10/18/14 1854 10/19/14 0441  WBC 12.4* 10.2  HGB 8.7* 7.8*  HCT 27.2* 24.1*  PLT 319 280   BMET  Recent Labs  10/18/14 1854 10/19/14 0441  NA 135 136  K 3.7 3.7  CL 102 105  CO2 23 23  GLUCOSE 93 99  BUN 25* 23*  CREATININE 2.34* 2.22*  CALCIUM 8.5* 7.9*   PT/INR  Recent Labs  10/19/14 0441  LABPROT 18.5*  INR 1.52   ABG No results for input(s): PHART, HCO3 in the last 72 hours.  Invalid input(s): PCO2, PO2  Studies/Results: No results found.  Anti-infectives: Anti-infectives    Start     Dose/Rate Route Frequency Ordered Stop   10/18/14 2200  clindamycin (CLEOCIN) IVPB 600 mg     600 mg 100 mL/hr over 30 Minutes Intravenous 3 times per day 10/18/14 2135        Assessment/Plan: s/p Procedure(s): DEBRIDEMENT ABDOMINAL WOUND   Status post drainage of abdominal wound. She doing very well continue IV anabiotic's. Patient is a resident at [facility]. Discussed with social services concerning placement back to [facility] tomorrow or the next day.  Florene Glen, MD, FACS  10/21/2014

## 2014-10-21 NOTE — Clinical Social Work Note (Signed)
Clinical Social Work Assessment  Patient Details  Name: Andrea Vega MRN: [id number] Date of Birth: 06-05-1950  Date of referral:  10/21/14               Reason for consult:  Facility Placement                Permission sought to share information with:  Facility Art therapist granted to share information::  Yes, Verbal Permission Granted  Name::        Agency::     Relationship::     Contact Information:     Housing/Transportation Living arrangements for the past 2 months:  [place] of Information:  Patient Patient Interpreter Needed:  None Criminal Activity/Legal Involvement Pertinent to Current Situation/Hospitalization:  No - Comment as needed Significant Relationships:  Adult Children Andrea Vega and Andrea Vega) Lives with:  Facility Resident Do you feel safe going back to the place where you live?  Yes Need for family participation in patient care:  No (Coment)  Care giving concerns:  None    Facilities manager / plan:  CSW spoke with patient this afternoon and patient confirmed she has been at [facility] since Friday for rehab. Patient states that she is pleased there and wishes to return to complete her rehab. Patient states she will more than likely transport via EMS. Patient is very pleasant and was grateful for CSW visit. FL2 placed on chart.   Employment status:  Retired Forensic scientist:  Medicare PT Recommendations:  [place] / Referral to community resources:  [place]  Patient/Family's Response to care:  positive  Patient/Family's Understanding of and Emotional Response to Diagnosis, Current Treatment, and Prognosis:  Patient verbalized understanding and agreement of potential discharge for tomorrow as stated by physician.   Emotional Assessment Appearance:  Appears stated age Attitude/Demeanor/Rapport:   (pleasant and cooperative) Affect (typically observed):   Accepting, Appropriate Orientation:  Oriented to Self, Oriented to Place, Oriented to  Time, Oriented to Situation Alcohol / Substance use:  Not Applicable Psych involvement (Current and /or in the community):  No (Comment)  Discharge Needs  Concerns to be addressed:  No discharge needs identified Readmission within the last 30 days:  No Current discharge risk:  None Barriers to Discharge:  No Barriers Identified   Shela Leff, LCSW 10/21/2014, 2:08 PM

## 2014-10-21 NOTE — Care Management Note (Signed)
Case Management Note  Patient Details  Name: Andrea Vega MRN: [id number] Date of Birth: [birth year]  Subjective/Objective:                    Action/Plan:   Expected Discharge Date:                  Expected Discharge Plan:     In-House Referral:     Discharge planning Services     Post Acute Care Choice:    Choice offered to:     DME Arranged:    DME Agency:     HH Arranged:    [place] Agency:     Status of Service:     Medicare Important Message Given:   yes Date Medicare IM Given:   10/21/14 Medicare IM give by:   MR Case Manager Date Additional Medicare IM Given:    Additional Medicare Important Message give by:     If discussed at [place] of Stay Meetings, dates discussed:    Additional Comments:  [name] A, RN 10/21/2014, 8:48 AM

## 2014-10-21 NOTE — Care Management Note (Signed)
Case Management Note  Patient Details  Name: MARIJA CALAMARI MRN: [id number] Date of Birth: [birth year]  Subjective/Objective:          Per Dr Burt Knack, Ms Mccravy will probably be discharged back to [facility] tomorrow to complete her rehab. CSW Caryl Pina is aware.           Action/Plan:   Expected Discharge Date:                  Expected Discharge Plan:     In-House Referral:     Discharge planning Services     Post Acute Care Choice:    Choice offered to:     DME Arranged:    DME Agency:     HH Arranged:    [place] Agency:     Status of Service:     Medicare Important Message Given:    Date Medicare IM Given:    Medicare IM give by:    Date Additional Medicare IM Given:    Additional Medicare Important Message give by:     If discussed at [place] of Stay Meetings, dates discussed:    Additional Comments:  Tamantha Saline A, RN 10/21/2014, 8:44 AM

## 2014-10-22 LAB — GLUCOSE, CAPILLARY
Glucose-Capillary: 82 mg/dL (ref 65–99)
Glucose-Capillary: 91 mg/dL (ref 65–99)
Glucose-Capillary: 103 mg/dL — ABNORMAL HIGH (ref 65–99)

## 2014-10-22 LAB — SURGICAL PATHOLOGY

## 2014-10-22 NOTE — Progress Notes (Signed)
3 Days Post-Op  Subjective: Patient feels very well today no new problems.  Objective: Vital signs in last 24 hours: Temp:  [97.6 F (36.4 C)-98.3 F (36.8 C)] 97.6 F (36.4 C) (06/14 1534) Pulse Rate:  [77-85] 79 (06/14 1534) Resp:  [17] 17 (06/13 1614) BP: (141-151)/(48-73) 151/73 mmHg (06/14 1534) SpO2:  [93 %-94 %] 93 % (06/14 1534) Last BM Date: 10/22/14  Intake/Output from previous day: 06/13 0701 - 06/14 0700 In: 3109.3 [P.O.:1020; I.V.:2044.3; IV Piggyback:45] Out: 845 [Urine:825; Stool:20] Intake/Output this shift: Total I/O In: 1693.7 [P.O.:720; I.V.:973.7] Out: 0   Physical exam:  Wound is healing well no purulence no foul odor packing is removed.  Lab Results: CBC  No results for input(s): WBC, HGB, HCT, PLT in the last 72 hours. BMET No results for input(s): NA, K, CL, CO2, GLUCOSE, BUN, CREATININE, CALCIUM in the last 72 hours. PT/INR No results for input(s): LABPROT, INR in the last 72 hours. ABG No results for input(s): PHART, HCO3 in the last 72 hours.  Invalid input(s): PCO2, PO2  Studies/Results: No results found.  Anti-infectives: Anti-infectives    Start     Dose/Rate Route Frequency Ordered Stop   10/18/14 2200  clindamycin (CLEOCIN) IVPB 600 mg     600 mg 100 mL/hr over 30 Minutes Intravenous 3 times per day 10/18/14 2135        Assessment/Plan: s/p Procedure(s): DEBRIDEMENT ABDOMINAL WOUND   Patient doing very well patient states that the packing is not being changed however it clearly has been. I will clarify this with nursing that they're doing a packing changes well. Possibly back to [place], MD, FACS  10/22/2014

## 2014-10-23 LAB — GLUCOSE, CAPILLARY
GLUCOSE-CAPILLARY: 83 mg/dL (ref 65–99)
Glucose-Capillary: 91 mg/dL (ref 65–99)
Glucose-Capillary: 94 mg/dL (ref 65–99)

## 2014-10-23 LAB — CULTURE, BLOOD (ROUTINE X 2)
Culture: NO GROWTH
Culture: NO GROWTH

## 2014-10-23 LAB — PLATELET COUNT: Platelets: 332 10*3/uL (ref 150–440)

## 2014-10-23 MED ORDER — CLINDAMYCIN HCL 300 MG PO CAPS: 300.0000 mg | ORAL_CAPSULE | Freq: Four times a day (QID) | ORAL | Status: DC

## 2014-10-23 NOTE — Progress Notes (Signed)
Physician Discharge Summary  Patient ID: Andrea Vega MRN: [id number] DOB/AGE: 12-06-1950 64 y.o.  Admit date: 10/18/2014 Discharge date: 10/23/2014  Note this is an amended discharge summary from the one sent to [facility] earlier this morning on the patient's discharge. Due to areas with the medical record the discharge medications were not correct on that summary and this discharge summary has the corrected medication list.  Discharge Diagnoses:  Active Problems:   Abscess and cellulitis   Discharged Condition: stable  Procedures: Drainage and debridement of left abdominal wall wound  Hospital Course: For complete detail see previous discharge summary. Patient had a drainage of a left-sided abdominal wall cellulitis and abscess resultant from prior perforated colon cancer into her retroperitoneum and side. She was treated with IV anabiotic some wound care and made an uncomplicated recovery she is transferred to [facility] in stable condition on a regular diet with instructions to follow up in our office next week for wound check. Her dressing is to be changed twice a day normal saline wet to dry with packing.  Consults: None  Significant Diagnostic Studies: CT scan   Disposition: 03-Skilled Nursing Facility  Discharge Instructions    Change dressing    Complete by:  As directed   Normal saline wet to dry dressing with gauze dressing/packing BID to left flank wound     Discharge patient    Complete by:  As directed   Discharge patient to [facility] when bed available            Medication List    STOP taking these medications        levofloxacin 500 MG tablet  Commonly known as:  LEVAQUIN     metroNIDAZOLE 500 MG tablet  Commonly known as:  FLAGYL      TAKE these medications        Acidophilus 100 MG Caps  Take 100 capsules by mouth daily.     amiodarone 100 MG tablet  Commonly known as:  PACERONE  Take 100 mg by mouth daily.     b  complex-vitamin c-folic acid 0.8 MG Tabs tablet  Take 1 tablet by mouth at bedtime.     camphor-menthol lotion  Commonly known as:  SARNA  Apply 1 application topically 2 (two) times daily.     clindamycin 300 MG capsule  Commonly known as:  CLEOCIN  Take 1 capsule (300 mg total) by mouth 4 (four) times daily.     famotidine 20 MG tablet  Commonly known as:  PEPCID  Take 20 mg by mouth 2 (two) times daily.     FIRST-DUKES MOUTHWASH MT  Use as directed 5 mLs in the mouth or throat 4 (four) times daily.     insulin lispro 100 UNIT/ML injection  Commonly known as:  HUMALOG  Inject 0-9 Units into the skin 4 (four) times daily as needed for high blood sugar. Patient uses as needed per sliding scale     losartan 100 MG tablet  Commonly known as:  COZAAR  Take 100 mg by mouth at bedtime.     metoCLOPramide 10 MG tablet  Commonly known as:  REGLAN  Take 10 mg by mouth 3 (three) times daily.     mirtazapine 15 MG disintegrating tablet  Commonly known as:  REMERON SOL-TAB  Take 7.5 mg by mouth at bedtime.     OxyCODONE HCl (Abuse Deter) 5 MG Taba  Commonly known as:  OXAYDO  Take 5  mg by mouth every 8 (eight) hours.     sertraline 100 MG tablet  Commonly known as:  ZOLOFT  Take 100 mg by mouth at bedtime.     simethicone 80 MG chewable tablet  Commonly known as:  MYLICON  Chew 80 mg by mouth 4 (four) times daily.     warfarin 4 MG tablet  Commonly known as:  COUMADIN  Take 4 mg by mouth daily.           Follow-up Information    Follow up with Marlyce Huge, MD. Go on 10/31/2014.   Specialty:  Surgery   Why:  For wound re-check @ 2:15 - in [place] office   Contact information:   [place] [address] [phone]       Florene Glen, MD, FACS

## 2014-10-23 NOTE — Discharge Instructions (Signed)
May shower Normal saline wet-to-dry dressings twice a day Regular diet Follow-up with Dr. Burt Knack in 7 days.

## 2014-10-23 NOTE — Progress Notes (Signed)
4 Days Post-Op  Subjective: Feels well minimal pain  Objective: Vital signs in last 24 hours: Temp:  [97.6 F (36.4 C)-98.4 F (36.9 C)] 97.7 F (36.5 C) (06/15 0744) Pulse Rate:  [74-79] 74 (06/15 0744) Resp:  [17-18] 18 (06/15 0744) BP: (136-151)/(65-73) 136/65 mmHg (06/15 0744) SpO2:  [92 %-93 %] 92 % (06/15 0744) Last BM Date: 10/22/14  Intake/Output from previous day: 06/14 0701 - 06/15 0700 In: 2825.7 [P.O.:960; I.V.:1725.7; IV Piggyback:140] Out: 1200 [Urine:1000; Stool:200] Intake/Output this shift: Total I/O In: 432 [I.V.:432] Out: 0   Physical exam:  Wound packing changed with no erythema or purulence  Lab Results: CBC   Recent Labs  10/23/14 0502  PLT 332   BMET No results for input(s): NA, K, CL, CO2, GLUCOSE, BUN, CREATININE, CALCIUM in the last 72 hours. PT/INR No results for input(s): LABPROT, INR in the last 72 hours. ABG No results for input(s): PHART, HCO3 in the last 72 hours.  Invalid input(s): PCO2, PO2  Studies/Results: No results found.  Anti-infectives: Anti-infectives    Start     Dose/Rate Route Frequency Ordered Stop   10/18/14 2200  clindamycin (CLEOCIN) IVPB 600 mg     600 mg 100 mL/hr over 30 Minutes Intravenous 3 times per day 10/18/14 2135        Assessment/Plan: s/p Procedure(s): DEBRIDEMENT ABDOMINAL WOUND   Patient doing very well recommend home or back to [facility] [place] today. We will arrange transfer.Florene Glen, MD, FACS  10/23/2014

## 2014-10-23 NOTE — Discharge Summary (Signed)
Physician Discharge Summary  Patient ID: Andrea Vega MRN: [id number] DOB/AGE: [birth year] 64 y.o.  Admit date: 10/18/2014 Discharge date: 10/23/2014   Discharge Diagnoses:  Active Problems:   Abscess and cellulitis   Discharged Condition: stable  Procedures: Drainage and wound debridement  Hospital Course: This a patient with a recent Hartman's procedure for colon cancer with perforation. She had a drain in place at one point which was removed and then developed cellulitis and abscess. She was in the hospital for this cellulitis and abscess and Dr. Leanora Cover debrided her wound. She has made an uncomplicated postoperative recovery and dressings are being changed at this point. She desires return to [facility] rehabilitation facility and this will be arranged with the addition of every 12 dressing changes.  Consults: None  Significant Diagnostic Studies: CT scan   Disposition: 03-Skilled Nursing Facility     Medication List    ASK your doctor about these medications        Acidophilus 100 MG Caps  Take 100 capsules by mouth daily.     amiodarone 100 MG tablet  Commonly known as:  PACERONE  Take 100 mg by mouth daily.     b complex-vitamin c-folic acid 0.8 MG Tabs tablet  Take 1 tablet by mouth at bedtime.     camphor-menthol lotion  Commonly known as:  SARNA  Apply 1 application topically 2 (two) times daily.     famotidine 20 MG tablet  Commonly known as:  PEPCID  Take 20 mg by mouth 2 (two) times daily.     FIRST-DUKES MOUTHWASH MT  Use as directed 5 mLs in the mouth or throat 4 (four) times daily.     insulin lispro 100 UNIT/ML injection  Commonly known as:  HUMALOG  Inject 0-9 Units into the skin 4 (four) times daily as needed for high blood sugar. Patient uses as needed per sliding scale     levofloxacin 500 MG tablet  Commonly known as:  LEVAQUIN  Take 500 mg by mouth every other day.     losartan 100 MG tablet  Commonly known as:  COZAAR  Take 100  mg by mouth at bedtime.     metoCLOPramide 10 MG tablet  Commonly known as:  REGLAN  Take 10 mg by mouth 3 (three) times daily.     metroNIDAZOLE 500 MG tablet  Commonly known as:  FLAGYL  Take 500 mg by mouth 3 (three) times daily.     mirtazapine 15 MG disintegrating tablet  Commonly known as:  REMERON SOL-TAB  Take 7.5 mg by mouth at bedtime.     OxyCODONE HCl (Abuse Deter) 5 MG Taba  Commonly known as:  OXAYDO  Take 5 mg by mouth every 8 (eight) hours.     sertraline 100 MG tablet  Commonly known as:  ZOLOFT  Take 100 mg by mouth at bedtime.     simethicone 80 MG chewable tablet  Commonly known as:  MYLICON  Chew 80 mg by mouth 4 (four) times daily.     warfarin 4 MG tablet  Commonly known as:  COUMADIN  Take 4 mg by mouth daily.         Florene Glen, MD, FACS

## 2014-10-23 NOTE — Progress Notes (Signed)
Pt received discharge orders per SCM. Report was called to [facility] and given to [employer] with all questions answered. IV removed with dressing dry and intact.

## 2014-10-25 ENCOUNTER — Other Ambulatory Visit: Payer: Self-pay | Admitting: *Deleted

## 2014-10-25 ENCOUNTER — Telehealth: Payer: Self-pay | Admitting: *Deleted

## 2014-10-25 ENCOUNTER — Other Ambulatory Visit
Admission: RE | Admit: 2014-10-25 | Discharge: 2014-10-25 | Disposition: A | Discharge status: Home / Self Care | Payer: No Typology Code available for payment source | Source: Other Acute Inpatient Hospital | Attending: Physician Assistant | Admitting: Physician Assistant

## 2014-10-25 DIAGNOSIS — I4891 Unspecified atrial fibrillation: Secondary | ICD-10-CM | POA: Insufficient documentation

## 2014-10-25 LAB — PROTIME-INR
INR: 1.33
PROTHROMBIN TIME: 16.7 s — AB (ref 11.4–15.0)

## 2014-10-25 MED ORDER — CIPROFLOXACIN HCL 500 MG PO TABS: 500.0000 mg | ORAL_TABLET | Freq: Two times a day (BID) | ORAL | Status: DC

## 2014-10-25 MED ORDER — SULFAMETHOXAZOLE-TRIMETHOPRIM 800-160 MG PO TABS: 1.0000 | ORAL_TABLET | Freq: Two times a day (BID) | ORAL | Status: AC

## 2014-10-25 NOTE — Telephone Encounter (Signed)
Faxed a prescription for Bactrim DS to [facility] [phone] as per Dr Burt Knack.

## 2014-10-25 NOTE — Telephone Encounter (Signed)
Andrea Vega with [facility] called and reported that the patient has had a allergic reaction to her antibiotic. The reaction consist of rash on her face back and chest. The provider on call has stopped the antibiotic and is requesting an alternate antibiotic.

## 2014-10-31 ENCOUNTER — Encounter: Payer: Self-pay | Admitting: Internal Medicine

## 2014-10-31 ENCOUNTER — Ambulatory Visit (INDEPENDENT_AMBULATORY_CARE_PROVIDER_SITE_OTHER): Payer: Medicare Other | Admitting: Surgery

## 2014-10-31 ENCOUNTER — Encounter: Payer: Self-pay | Admitting: Surgery

## 2014-10-31 VITALS — BP 133/77 | HR 78 | Temp 98.0°F | Ht 67.0 in | Wt 219.0 lb

## 2014-10-31 DIAGNOSIS — C189 Malignant neoplasm of colon, unspecified: Secondary | ICD-10-CM

## 2014-10-31 DIAGNOSIS — T8149XA Infection following a procedure, other surgical site, initial encounter: Secondary | ICD-10-CM | POA: Insufficient documentation

## 2014-10-31 DIAGNOSIS — T814XXD Infection following a procedure, subsequent encounter: Secondary | ICD-10-CM

## 2014-10-31 DIAGNOSIS — IMO0001 Reserved for inherently not codable concepts without codable children: Secondary | ICD-10-CM

## 2014-10-31 HISTORY — DX: Malignant neoplasm of colon, unspecified: C18.9

## 2014-10-31 NOTE — Progress Notes (Signed)
S: Doing well.  Min pain.  No redness/swelling from wound.  Undergoing bid wet to dry dressing changes to wound.  Blood pressure 133/77, pulse 78, temperature 98 F (36.7 C), temperature source Oral, height 5\' 7"  (1.702 m), weight 219 lb (99.338 kg). ABD: approx 2.5 x 2 x 1.5 cm wound left flank, tunnels up slightly, good granulation tissue, no purulence  A/P 64 yo F s/p I and D of abdominal wall abscess.  Doing well.  Continue wet to dry dressing changes bid.  Rx for percocet refilled.

## 2014-10-31 NOTE — Progress Notes (Signed)
[facility]  Telephone:(336) [phone] Fax:(336) [phone]     ID: [name] OB: [birth year]  MR#: [id number]  OVF#:[id number]  Patient Care Team: Lavonne Chick, MD as PCP - General (Family Medicine)  CHIEF COMPLAINT/DIAGNOSIS:  Stage IIIB (pT4a pN1a cM0) left-sided adenocarcinoma of the colon status post partial colectomy and lymph node dissection on 07/11/2014. 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   (Hospitalized 07/10/2014 with abdominal pain, CT scan of the abdomen and pelvis reported bowel obstruction caused by masslike thickening in the descending colon, stable left adrenal adenoma, diffuse hepatic steatosis, lumbar spondylosis, and degenerative disk disease. The patient then underwent partial colectomy on the left side, appendectomy, and colostomy on March 3 with pathology reporting tumor penetrating the visceral peritoneum with pT4a and pN1a with 1 of 13 regional lymph nodes positive for metastasis. 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   HISTORY OF PRESENT ILLNESS:  Andrea Vega is a 64 year old female with history of multiple medical problems as described below, she was seen as in hospital consultation on 07/10/2014 when she was admitted with abdominal pain and found to have a left-sided colon mass. She underwent partial colectomy and lymph node dissection on March 3 with surgical pathology reporting as above. Patient had significant acute illness during that hospitalization including renal failure and has been on hemodialysis up until 2-3 weeks ago and then came off of it now. From [facility] she was sent to [facility] facility in [place] where she was for the last 2 months and has now finally been discharged home and has been referred back here for oncology evaluation since she had stage III colon cancer. Clinically the patient states that she is still extremely weak, ambulates  normally. She has chronic dyspnea on exertion. Denies any major abdominal or pelvic pain. States her bowel movements are doing fairly steady, no blood in stools or urine. Appetite is steady, denies any further unintentional weight loss. No fevers or chills.  REVIEW OF SYSTEMS:   ROS As in HPI above. In addition, no new headaches or focal weakness.  No new mood disturbances. No  sore throat, cough, hemoptysis or chest pain. No dizziness or palpitation. No abdominal pain, constipation, diarrhea, dysuria or hematuria. No new skin rash or bleeding symptoms. States that she has chronic tingling and numbness in hands and feet from diabetes. Denies polyuria polydipsia. PS ECOG 2.  PAST MEDICAL HISTORY: Reviewed. Past Medical History  Diagnosis Date  . Atrial flutter     a. post op and in the setting of sepsis; b. not on long term anticoagulation; c. echo 07/2014: EF 55-60%, technically difficult study  . Obesity   . Diabetes mellitus   . GERD (gastroesophageal reflux disease)   . HTN (hypertension)   . Colon cancer 10/31/2014  Diabetes, hypertension, obesity, tobacco abuse, depression. Cholecystectomy December 2015 Hospitalized 07/10/2014 with abdominal pain, CT scan of the abdomen and pelvis reported bowel obstruction caused by masslike thickening in the descending colon, stable left adrenal adenoma, diffuse hepatic steatosis, lumbar spondylosis, and degenerative disk disease. The patient then underwent partial colectomy on the left side, appendectomy, and colostomy on March 3 with pathology reporting tumor penetrating the visceral peritoneum with pT4a and pN1a with 1 of 13 regional lymph nodes positive for metastasis. 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   PAST SURGICAL HISTORY: Reviewed. Past Surgical History  Procedure Laterality Date  . Colon surgery    .  Cholecystectomy    . Wound debridement Left 10/19/2014    Procedure: DEBRIDEMENT ABDOMINAL  WOUND;  Surgeon: Molly Maduro, MD;  Location: [facility] ORS;  Service: General;  Laterality: Left;    FAMILY HISTORY: Reviewed. Family History  Problem Relation Age of Onset  . Colon cancer Mother   . Diabetes Mother   . Hypertension Mother   . Lung cancer Father   . Lung cancer Paternal Grandfather     ADVANCED DIRECTIVES:  <no information>  SOCIAL HISTORY: Reviewed. History  Substance Use Topics  . Smoking status: Former Smoker    Quit date: 07/10/2014  . Smokeless tobacco: Not on file  . Alcohol Use: No    Allergies  Allergen Reactions  . Lorazepam Other (See Comments)    Reaction: unknown  . Morphine And Related Other (See Comments)    Reaction: unknown  . Penicillins Hives  . Zosyn [Piperacillin Sod-Tazobactam So] Other (See Comments)    Reaction: unknown    Current Outpatient Prescriptions  Medication Sig Dispense Refill  . amiodarone (PACERONE) 100 MG tablet Take 100 mg by mouth daily.    Marland Kitchen b complex-vitamin c-folic acid (NEPHRO-VITE) 0.8 MG TABS tablet Take 1 tablet by mouth at bedtime.    . famotidine (PEPCID) 20 MG tablet Take 20 mg by mouth 2 (two) times daily.    . Lactobacillus (ACIDOPHILUS) 100 MG CAPS Take 100 capsules by mouth daily.    Marland Kitchen losartan (COZAAR) 100 MG tablet Take 100 mg by mouth at bedtime.     . metoCLOPramide (REGLAN) 10 MG tablet Take 10 mg by mouth 3 (three) times daily.     . mirtazapine (REMERON SOL-TAB) 15 MG disintegrating tablet Take 7.5 mg by mouth at bedtime.    . OxyCODONE HCl, Abuse Deter, (OXAYDO) 5 MG TABA Take 5 mg by mouth every 8 (eight) hours.    . sertraline (ZOLOFT) 100 MG tablet Take 100 mg by mouth at bedtime.     . simethicone (MYLICON) 80 MG chewable tablet Chew 80 mg by mouth 4 (four) times daily.     . camphor-menthol (SARNA) lotion Apply 1 application topically 2 (two) times daily.    . clindamycin (CLEOCIN) 300 MG capsule Take 1 capsule (300 mg total) by mouth 4 (four) times daily. 30 capsule 1  . insulin lispro  (HUMALOG) 100 UNIT/ML injection Inject 0-9 Units into the skin 4 (four) times daily as needed for high blood sugar. Patient uses as needed per sliding scale    . sulfamethoxazole-trimethoprim (BACTRIM DS) 800-160 MG per tablet Take 1 tablet by mouth 2 (two) times daily. 28 tablet 0  . warfarin (COUMADIN) 4 MG tablet Take 4 mg by mouth daily.     No current facility-administered medications for this visit.    PHYSICAL EXAM: Filed Vitals:   10/14/14 1535  BP: 113/71  Pulse: 83  Temp: 99.4 F (37.4 C)  Resp: 18     Body mass index is 35.49 kg/(m^2).    ECOG FS:2 - Symptomatic, <50% confined to bed  GENERAL: Patient is overall weak looking, sitting in a wheelchair, otherwise alert and oriented and in no acute distress. There is no icterus. HEENT: EOMs intact. Oral exam negative for thrush or lesions. No cervical lymphadenopathy. CVS: S1S2, regular LUNGS: Bilaterally diminished breath sounds overall especially at bases, no rhonchi. ABDOMEN: Soft, nontender. No hepatosplenomegaly clinically.  EXTREMITIES: Trace pedal edema. LYMPHATICS: No palpable adenopathy in axillary or inguinal areas.   LAB RESULTS: Today - Creatinine 2.43, calcium 8.7,  bilirubin 0.4, total protein 7.3, AST 23, ALT 12, alkaline phosphatase 174, WBC 12,300, hemoglobin 9.4, platelets 308, 78% neutrophils, CEA 14.2.  STUDIES: 10/03/2014 -  CT scan of the abdomen/pelvis. IMPRESSION: 1. Left upper quadrant drain in place with near complete resolution of perisplenic fluid collection. 2. Otherwise, degraded exam secondary to multiple factors detailed above. 3. Decrease in left pleural effusion with adjacent left lower lobe atelectasis or infection. 4. Similar size a left adrenal nodule. 5. Possible constipation. 6. Hepatomegaly and trace ascites.  STAGING: Stage IIIB   ASSESSMENT / PLAN:   1. Stage IIIB (pT4a pN1a cM0) left-sided adenocarcinoma of the colon status post partial colectomy and lymph node dissection on  07/11/2014. 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  (Hospitalized 07/10/14 with abdominal pain, CT scan of the abdomen and pelvis reported bowel obstruction caused by masslike thickening in the descending colon, stable left adrenal adenoma, diffuse hepatic steatosis, lumbar spondylosis, and degenerative disk disease. The patient then underwent partial colectomy on the left side, appendectomy, and colostomy on March 3 with pathology reporting tumor penetrating the visceral peritoneum with pT4a and pN1a with 1 of 13 regional lymph nodes positive for metastasis. 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 Have also explained that for stage III colon cancer, standard recommendation is pursuing adjuvant chemotherapy with regimen like FOLFOX given every 2 weeks 12 doses, and discussed overall risk of metastatic recurrence of stage III colon cancer and possibly benefit from chemotherapy. Also discussed possible side effect profile of chemotherapeutic regimen. Patient has had recent significant prolonged illness and hospitalization, has just stopped hemodialysis about 2 weeks ago and has poor performance status. She also has diabetes with symptoms of neuropathy and therefore unlikely to be able to receive oxaliplatin. Patient at this point in time is interested in pursuing chemotherapy to try and get any benefit possible to prevent cancer recurrence, we discussed with surgeon Dr. Burt Knack about feasibility of placing a Port-A-Cath and see her back after it is done and plan treatment. Patient in the meantime will also discuss with her daughter [name] about pursuing treatment. In addition, serum CEA level today is elevated at 14.2 and therefore get PET scan for restaging colon cancer to rule out metastatic disease. 2. Anemia - likely secondary to recent renal insufficiency.  Patient does not do much physical activity and does not want to consider ESA therapy like Procrit at this time. Continue to monitor.     3. In between visits, the patient has been advised to call or come to the ER in case of fevers, bleeding, acute sickness or new symptoms. Patient is agreeable to this plan.      Leia Alf, MD   10/31/2014 6:04 PM

## 2014-10-31 NOTE — Patient Instructions (Signed)
Miralax Daily as needed for constipation.   You may try Magnesium Citrate is Miralax does not work.   Call our office if neither if these help.  Follow-up in 3 weeks in our office.

## 2014-10-31 NOTE — Progress Notes (Deleted)
Subjective:     Patient ID: Andrea Vega, female   DOB: 10-01-1950, 64 y.o.   MRN: [id number]  HPI   Review of Systems     Objective:   Physical Exam     Assessment:     ***    Plan:     ***

## 2014-11-04 NOTE — Progress Notes (Signed)
[facility]  Telephone:(336) [phone] Fax:(336) [phone]     ID: Andrea Vega OB: [birth year]  MR#: [id number]  FXT#:[id number]  Patient Care Team: Lavonne Chick, MD as PCP - General (Family Medicine)  CHIEF COMPLAINT/DIAGNOSIS:  Stage IIIB (pT4a pN1a cM0) left-sided adenocarcinoma of the colon status post partial colectomy and lymph node dissection on 07/11/2014. 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   (Hospitalized 07/10/2014 with abdominal pain, CT scan of the abdomen and pelvis reported bowel obstruction caused by masslike thickening in the descending colon, stable left adrenal adenoma, diffuse hepatic steatosis, lumbar spondylosis, and degenerative disk disease. The patient then underwent partial colectomy on the left side, appendectomy, and colostomy on March 3 with pathology reporting tumor penetrating the visceral peritoneum with pT4a and pN1a with 1 of 13 regional lymph nodes positive for metastasis. 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   10/14/14 - serum CEA is 14.2.  10/16/14 - PET scan. IMPRESSION: 1. Hypermetabolic loculated fluid adjacent to the spleen may be due to residual abscesses. Peritoneal carcinomatosis is not excluded. 2. Hypermetabolic nodularity, haziness and soft tissue thickening along the left abdomen, as described above, possibly postoperative in nature. Peritoneal carcinomatosis can also have this appearance. 3. Hypermetabolism deep to the rectus abdominus musculature may be postoperative in etiology. 4. Mildly hypermetabolic low right paratracheal lymph node. 5. Moderate left pleural effusion with compressive atelectasis in the left lower lobe. 6. Left adrenal adenoma.  HISTORY OF PRESENT ILLNESS:  Patient returns for f/u, she had PET scan done as described above since CEA is elevated. Clinically the patient states that she is still extremely weak,  ambulatesminimally. Denies any major abdominal or pelvic pain. States her bowel movements are doing fairly steady, no blood in stools or urine. Appetite is steady.  REVIEW OF SYSTEMS:   ROS  As in HPI above. In addition, no new headaches or focal weakness.  No new sore throat, cough, hemoptysis or chest pain. States that she has chronic tingling and numbness in hands and feet from diabetes. PS ECOG 2.  PAST MEDICAL HISTORY: Reviewed. Past Medical History  Diagnosis Date  . Atrial flutter     a. post op and in the setting of sepsis; b. not on long term anticoagulation; c. echo 07/2014: EF 55-60%, technically difficult study  . Obesity   . Diabetes mellitus   . GERD (gastroesophageal reflux disease)   . HTN (hypertension)   . Colon cancer 10/31/2014  Diabetes, hypertension, obesity, tobacco abuse, depression. Cholecystectomy December 2015 Hospitalized 07/10/2014 with abdominal pain, CT scan of the abdomen and pelvis reported bowel obstruction caused by masslike thickening in the descending colon, stable left adrenal adenoma, diffuse hepatic steatosis, lumbar spondylosis, and degenerative disk disease. The patient then underwent partial colectomy on the left side, appendectomy, and colostomy on March 3 with pathology reporting tumor penetrating the visceral peritoneum with pT4a and pN1a with 1 of 13 regional lymph nodes positive for metastasis. 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   PAST SURGICAL HISTORY: Reviewed. Past Surgical History  Procedure Laterality Date  . Colon surgery    . Cholecystectomy    . Wound debridement Left 10/19/2014    Procedure: DEBRIDEMENT ABDOMINAL WOUND;  Surgeon: Molly Maduro, MD;  Location: [facility] ORS;  Service: General;  Laterality: Left;    FAMILY HISTORY: Reviewed. Family History  Problem Relation Age of Onset  . Colon cancer Mother   .  Diabetes Mother   . Hypertension Mother   . Lung cancer Father   . Lung  cancer Paternal Grandfather     ADVANCED DIRECTIVES:  No - patient declined information  SOCIAL HISTORY: Reviewed. History  Substance Use Topics  . Smoking status: Former Smoker    Quit date: 07/10/2014  . Smokeless tobacco: Not on file  . Alcohol Use: No    Allergies  Allergen Reactions  . Lorazepam Other (See Comments)    Reaction: unknown  . Morphine And Related Other (See Comments)    Reaction: unknown  . Penicillins Hives  . Zosyn [Piperacillin Sod-Tazobactam So] Other (See Comments)    Reaction: unknown    Current Outpatient Prescriptions  Medication Sig Dispense Refill  . amiodarone (PACERONE) 100 MG tablet Take 100 mg by mouth daily.    Marland Kitchen b complex-vitamin c-folic acid (NEPHRO-VITE) 0.8 MG TABS tablet Take 1 tablet by mouth at bedtime.    . famotidine (PEPCID) 20 MG tablet Take 20 mg by mouth 2 (two) times daily.    . Lactobacillus (ACIDOPHILUS) 100 MG CAPS Take 100 capsules by mouth daily.    Marland Kitchen losartan (COZAAR) 100 MG tablet Take 100 mg by mouth at bedtime.     . metoCLOPramide (REGLAN) 10 MG tablet Take 10 mg by mouth 3 (three) times daily.     . mirtazapine (REMERON SOL-TAB) 15 MG disintegrating tablet Take 7.5 mg by mouth at bedtime.    . OxyCODONE HCl, Abuse Deter, (OXAYDO) 5 MG TABA Take 5 mg by mouth every 8 (eight) hours.    . sertraline (ZOLOFT) 100 MG tablet Take 100 mg by mouth at bedtime.     . simethicone (MYLICON) 80 MG chewable tablet Chew 80 mg by mouth 4 (four) times daily.     Marland Kitchen warfarin (COUMADIN) 4 MG tablet Take 4 mg by mouth daily.    . camphor-menthol (SARNA) lotion Apply 1 application topically 2 (two) times daily.    . clindamycin (CLEOCIN) 300 MG capsule Take 1 capsule (300 mg total) by mouth 4 (four) times daily. 30 capsule 1  . insulin lispro (HUMALOG) 100 UNIT/ML injection Inject 0-9 Units into the skin 4 (four) times daily as needed for high blood sugar. Patient uses as needed per sliding scale    . sulfamethoxazole-trimethoprim  (BACTRIM DS) 800-160 MG per tablet Take 1 tablet by mouth 2 (two) times daily. 28 tablet 0   No current facility-administered medications for this visit.    PHYSICAL EXAM: Filed Vitals:   10/18/14 0844  BP: 124/78  Pulse: 84  Temp: 98.3 F (36.8 C)  Resp: 18     There is no weight on file to calculate BMI.    ECOG FS:2 - Symptomatic, <50% confined to bed  GENERAL: Patient is overall weak looking, sitting in a wheelchair, otherwise alert and oriented and in no acute distress. No icterus. LUNGS: Bilaterally diminished breath sounds overall especially at bases, no rhonchi. ABDOMEN: Soft, nontender. No hepatosplenomegaly clinically.  EXTREMITIES: Trace pedal edema  LAB RESULTS: Today - Creatinine 2.43, calcium 8.7, bilirubin 0.4, total protein 7.3, AST 23, ALT 12, alkaline phosphatase 174, WBC 12,300, hemoglobin 9.4, platelets 308, 78% neutrophils, CEA 14.2.  STUDIES: 10/03/2014 -  CT scan of the abdomen/pelvis. IMPRESSION: 1. Left upper quadrant drain in place with near complete resolution of perisplenic fluid collection. 2. Otherwise, degraded exam secondary to multiple factors detailed above. 3. Decrease in left pleural effusion with adjacent left lower lobe atelectasis or infection. 4. Similar size  a left adrenal nodule. 5. Possible constipation. 6. Hepatomegaly and trace ascites.  10/16/14 - PET scan. IMPRESSION: 1. Hypermetabolic loculated fluid adjacent to the spleen may be due to residual abscesses. Peritoneal carcinomatosis is not excluded. 2. Hypermetabolic nodularity, haziness and soft tissue thickening along the left abdomen, as described above, possibly postoperative in nature. Peritoneal carcinomatosis can also have this appearance. 3. Hypermetabolism deep to the rectus abdominus musculature may be postoperative in etiology. 4. Mildly hypermetabolic low right paratracheal lymph node. 5. Moderate left pleural effusion with compressive atelectasis in the left lower lobe. 6. Left  adrenal adenoma.  STAGING: Stage IIIB   ASSESSMENT / PLAN:   1. Stage IIIB (pT4a pN1a cM0) left-sided adenocarcinoma of the colon status post partial colectomy and lymph node dissection on 07/11/2014. 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  (Hospitalized 07/10/14 with abdominal pain, CT scan of the abdomen and pelvis reported bowel obstruction caused by masslike thickening in the descending colon, stable left adrenal adenoma, diffuse hepatic steatosis, lumbar spondylosis, and degenerative disk disease. The patient then underwent partial colectomy on the left side, appendectomy, and colostomy on March 3 with pathology reporting tumor penetrating the visceral peritoneum with pT4a and pN1a with 1 of 13 regional lymph nodes positive for metastasis. 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 Have also d/w Radiologist , there is hypermetabolic nodularity on PET scan along left abdomen, loculated fluid adjacent to spleen and mildly hypermetabolic right paratracheal lymph node along with left pleural effusion. Radiologist feels these are more likely inflammatory/post-operative changes but given elevated serum CEA it could be malignancy also in which case this would represent advanced metastatic colon cancer. After detailed d/w patient, and explaining that at this time none of the PET scan abnormalities are biopsiable per Radiology, she has made decision not to pursue any kind of adjuvant chemotherapy including milder regimen like oral Xeloda due to her poor performance status and co-morbidities and understands the risks of not taking adjuvant chemotherapy including recurrent/progressive malignancy and possible death from recurrent cancer.Have also d/w patient's daughter [name] over the phone who also concurs with patient's decision. Will monitor, see her back in 3 months with  labs including CEA level.  2. Anemia - likely secondary to recent renal insufficiency. Patient does not do much physical activity and does not want to consider ESA therapy like Procrit at this time. Continue to monitor.     3. In between visits, the patient has been advised to call or come to the ER in case of fevers, bleeding, acute sickness or new symptoms. Patient is agreeable to this plan.      Leia Alf, MD   11/04/2014 6:55 AM

## 2014-11-05 ENCOUNTER — Encounter
Admission: RE | Disposition: A | Discharge status: Home / Self Care | Payer: Self-pay | Source: Ambulatory Visit | Attending: Vascular Surgery

## 2014-11-05 ENCOUNTER — Ambulatory Visit
Admission: RE | Admit: 2014-11-05 | Discharge: 2014-11-05 | Disposition: A | Discharge status: Home / Self Care | Payer: Medicare Other | Source: Ambulatory Visit | Attending: Vascular Surgery | Admitting: Vascular Surgery

## 2014-11-05 DIAGNOSIS — E1122 Type 2 diabetes mellitus with diabetic chronic kidney disease: Secondary | ICD-10-CM | POA: Diagnosis not present

## 2014-11-05 DIAGNOSIS — E669 Obesity, unspecified: Secondary | ICD-10-CM | POA: Insufficient documentation

## 2014-11-05 DIAGNOSIS — Z87891 Personal history of nicotine dependence: Secondary | ICD-10-CM | POA: Insufficient documentation

## 2014-11-05 DIAGNOSIS — Z79899 Other long term (current) drug therapy: Secondary | ICD-10-CM | POA: Diagnosis not present

## 2014-11-05 DIAGNOSIS — Z7901 Long term (current) use of anticoagulants: Secondary | ICD-10-CM | POA: Diagnosis not present

## 2014-11-05 DIAGNOSIS — Z992 Dependence on renal dialysis: Secondary | ICD-10-CM | POA: Diagnosis not present

## 2014-11-05 DIAGNOSIS — I120 Hypertensive chronic kidney disease with stage 5 chronic kidney disease or end stage renal disease: Secondary | ICD-10-CM | POA: Insufficient documentation

## 2014-11-05 DIAGNOSIS — C189 Malignant neoplasm of colon, unspecified: Secondary | ICD-10-CM | POA: Diagnosis not present

## 2014-11-05 DIAGNOSIS — N289 Disorder of kidney and ureter, unspecified: Secondary | ICD-10-CM | POA: Diagnosis not present

## 2014-11-05 DIAGNOSIS — T82898A Other specified complication of vascular prosthetic devices, implants and grafts, initial encounter: Secondary | ICD-10-CM | POA: Diagnosis not present

## 2014-11-05 HISTORY — PX: PERIPHERAL VASCULAR CATHETERIZATION: SHX172C

## 2014-11-05 SURGERY — DIALYSIS/PERMA CATHETER REMOVAL
Anesthesia: Moderate Sedation

## 2014-11-05 SURGICAL SUPPLY — 6 items
BLADE SURG SZ11 CARB STEEL (BLADE) ×3 IMPLANT
DRSG TEGADERM 4X4.75 (GAUZE/BANDAGES/DRESSINGS) ×3 IMPLANT
DURAPREP 26ML APPLICATOR (WOUND CARE) ×3 IMPLANT
SUT MON AB 4-0 PC3 18 (SUTURE) ×3 IMPLANT
TRAY LACERAT/PLASTIC (MISCELLANEOUS) ×3 IMPLANT
VALVE HEMOSTASIS (MISCELLANEOUS) ×3 IMPLANT

## 2014-11-06 ENCOUNTER — Encounter: Payer: Self-pay | Admitting: Vascular Surgery

## 2014-11-06 NOTE — H&P (Signed)
[facility] History & Physical Update  The patient was interviewed and re-examined.  The patient's previous History and Physical has been reviewed and is unchanged.  There is no change in the plan of care.  Stashia Sia, Dolores Lory, MD  11/06/2014, 4:05 PM

## 2014-11-07 ENCOUNTER — Other Ambulatory Visit: Payer: Self-pay

## 2014-11-07 ENCOUNTER — Ambulatory Visit
Admission: RE | Admit: 2014-11-07 | Discharge: 2014-11-07 | Disposition: A | Discharge status: Home / Self Care | Payer: Medicare Other | Source: Ambulatory Visit | Attending: Vascular Surgery | Admitting: Vascular Surgery

## 2014-11-07 ENCOUNTER — Encounter
Admission: RE | Admit: 2014-11-07 | Discharge: 2014-11-07 | Disposition: A | Discharge status: Home / Self Care | Payer: Medicare Other | Source: Ambulatory Visit | Attending: Vascular Surgery | Admitting: Vascular Surgery

## 2014-11-07 DIAGNOSIS — N186 End stage renal disease: Secondary | ICD-10-CM | POA: Diagnosis present

## 2014-11-07 DIAGNOSIS — Z833 Family history of diabetes mellitus: Secondary | ICD-10-CM | POA: Diagnosis not present

## 2014-11-07 DIAGNOSIS — I10 Essential (primary) hypertension: Secondary | ICD-10-CM | POA: Insufficient documentation

## 2014-11-07 DIAGNOSIS — J90 Pleural effusion, not elsewhere classified: Secondary | ICD-10-CM | POA: Insufficient documentation

## 2014-11-07 DIAGNOSIS — Z801 Family history of malignant neoplasm of trachea, bronchus and lung: Secondary | ICD-10-CM | POA: Insufficient documentation

## 2014-11-07 DIAGNOSIS — Z01811 Encounter for preprocedural respiratory examination: Secondary | ICD-10-CM | POA: Insufficient documentation

## 2014-11-07 DIAGNOSIS — Z8249 Family history of ischemic heart disease and other diseases of the circulatory system: Secondary | ICD-10-CM | POA: Insufficient documentation

## 2014-11-07 DIAGNOSIS — Z01812 Encounter for preprocedural laboratory examination: Secondary | ICD-10-CM | POA: Diagnosis present

## 2014-11-07 DIAGNOSIS — N289 Disorder of kidney and ureter, unspecified: Secondary | ICD-10-CM | POA: Insufficient documentation

## 2014-11-07 DIAGNOSIS — E669 Obesity, unspecified: Secondary | ICD-10-CM | POA: Diagnosis not present

## 2014-11-07 DIAGNOSIS — T8140XA Infection following a procedure, unspecified, initial encounter: Secondary | ICD-10-CM

## 2014-11-07 DIAGNOSIS — C189 Malignant neoplasm of colon, unspecified: Secondary | ICD-10-CM | POA: Diagnosis not present

## 2014-11-07 DIAGNOSIS — J9811 Atelectasis: Secondary | ICD-10-CM | POA: Diagnosis not present

## 2014-11-07 DIAGNOSIS — Z87891 Personal history of nicotine dependence: Secondary | ICD-10-CM | POA: Insufficient documentation

## 2014-11-07 HISTORY — DX: Unspecified osteoarthritis, unspecified site: M19.90

## 2014-11-07 HISTORY — DX: Chronic kidney disease, unspecified: N18.9

## 2014-11-07 HISTORY — DX: Other injury of unspecified body region, initial encounter: T14.8XXA

## 2014-11-07 HISTORY — DX: Hyperlipidemia, unspecified: E78.5

## 2014-11-07 LAB — BASIC METABOLIC PANEL
Anion gap: 9 (ref 5–15)
BUN: 33 mg/dL — ABNORMAL HIGH (ref 6–20)
CALCIUM: 9.2 mg/dL (ref 8.9–10.3)
CO2: 23 mmol/L (ref 22–32)
Chloride: 101 mmol/L (ref 101–111)
Creatinine, Ser: 2.72 mg/dL — ABNORMAL HIGH (ref 0.44–1.00)
GFR calc Af Amer: 20 mL/min — ABNORMAL LOW (ref >60)
GFR, EST NON AFRICAN AMERICAN: 18 mL/min — AB (ref >60)
Glucose, Bld: 83 mg/dL (ref 65–99)
Potassium: 5.7 mmol/L — ABNORMAL HIGH (ref 3.5–5.1)
Sodium: 133 mmol/L — ABNORMAL LOW (ref 135–145)

## 2014-11-07 LAB — TYPE AND SCREEN
ABO/RH(D): A POS
Antibody Screen: NEGATIVE

## 2014-11-07 LAB — CBC
HEMATOCRIT: 29.3 % — AB (ref 35.0–47.0)
HEMOGLOBIN: 9.3 g/dL — AB (ref 12.0–16.0)
MCH: 28.1 pg (ref 26.0–34.0)
MCHC: 31.9 g/dL — AB (ref 32.0–36.0)
MCV: 88.3 fL (ref 80.0–100.0)
Platelets: 266 10*3/uL (ref 150–440)
RBC: 3.31 MIL/uL — ABNORMAL LOW (ref 3.80–5.20)
RDW: 19.3 % — ABNORMAL HIGH (ref 11.5–14.5)
WBC: 8.5 10*3/uL (ref 3.6–11.0)

## 2014-11-07 LAB — ABO/RH: ABO/RH(D): A POS

## 2014-11-07 LAB — PROTIME-INR
INR: 1.11
Prothrombin Time: 14.5 seconds (ref 11.4–15.0)

## 2014-11-07 LAB — APTT: APTT: 31 s (ref 24–36)

## 2014-11-07 MED ORDER — OXYCODONE-ACETAMINOPHEN 5-325 MG PO TABS: 1.0000 | ORAL_TABLET | ORAL | Status: DC | PRN

## 2014-11-07 NOTE — Patient Instructions (Signed)
  Your procedure is scheduled on: 11/15/14 Fri Report to Day Surgery. To find out your arrival time please call [phone] between 1PM - 3PM on 11/14/14 Thurs.  Remember: Instructions that are not followed completely may result in serious medical risk, up to and including death, or upon the discretion of your surgeon and anesthesiologist your surgery may need to be rescheduled.    _x___ 1. Do not eat food or drink liquids after midnight. No gum chewing or hard candies.     ____ 2. No Alcohol for 24 hours before or after surgery.   ____ 3. Bring all medications with you on the day of surgery if instructed.    _x___ 4. Notify your doctor if there is any change in your medical condition     (cold, fever, infections).     Do not wear jewelry, make-up, hairpins, clips or nail polish.  Do not wear lotions, powders, or perfumes. You may wear deodorant.  Do not shave 48 hours prior to surgery. Men may shave face and neck.  Do not bring valuables to the hospital.    [facility] is not responsible for any belongings or valuables.               Contacts, dentures or bridgework may not be worn into surgery.  Leave your suitcase in the car. After surgery it may be brought to your room.  For patients admitted to the hospital, discharge time is determined by your                treatment team.   Patients discharged the day of surgery will not be allowed to drive home.   Please read over the following fact sheets that you were given:      __x__ Take these medicines the morning of surgery with A SIP OF WATER:    1. amiodarone (PACERONE) 100 MG tablet  2. famotidine (PEPCID) 20 MG tablet  3. OxyCODONE HCl, Abuse Deter, (OXAYDO) 5 MG TABA  4.  5.  6.  ____ Fleet Enema (as directed)   _x__ Use CHG Soap as directed  ____ Use inhalers on the day of surgery  ____ Stop metformin 2 days prior to surgery    ____ Take 1/2 of usual insulin dose the night before surgery and none on the morning of  surgery.   _x___ Stop Coumadin/Plavix/aspirin on stop coumadin 4 days before surgery  ____ Stop Anti-inflammatories on    ____ Stop supplements until after surgery.    _x___ Bring C-Pap to the hospital. Bring BIPAP

## 2014-11-07 NOTE — Pre-Procedure Instructions (Signed)
Dr Nino Parsley office called regarding allergy to [facility] and Clindamycin

## 2014-11-13 NOTE — OR Nursing (Signed)
Called office regarding allergies.

## 2014-11-15 ENCOUNTER — Ambulatory Visit
Admission: RE | Admit: 2014-11-15 | Discharge: 2014-11-15 | Disposition: A | Discharge status: Home / Self Care | Payer: Medicare Other | Source: Ambulatory Visit | Attending: Vascular Surgery | Admitting: Vascular Surgery

## 2014-11-15 ENCOUNTER — Ambulatory Visit: Payer: Medicare Other | Admitting: Certified Registered"

## 2014-11-15 ENCOUNTER — Encounter
Admission: RE | Disposition: A | Discharge status: Home / Self Care | Payer: Self-pay | Source: Ambulatory Visit | Attending: Vascular Surgery

## 2014-11-15 ENCOUNTER — Encounter: Payer: Self-pay | Admitting: *Deleted

## 2014-11-15 DIAGNOSIS — K219 Gastro-esophageal reflux disease without esophagitis: Secondary | ICD-10-CM | POA: Insufficient documentation

## 2014-11-15 DIAGNOSIS — Z85038 Personal history of other malignant neoplasm of large intestine: Secondary | ICD-10-CM | POA: Diagnosis not present

## 2014-11-15 DIAGNOSIS — N184 Chronic kidney disease, stage 4 (severe): Secondary | ICD-10-CM | POA: Insufficient documentation

## 2014-11-15 DIAGNOSIS — G4733 Obstructive sleep apnea (adult) (pediatric): Secondary | ICD-10-CM | POA: Insufficient documentation

## 2014-11-15 DIAGNOSIS — Z8249 Family history of ischemic heart disease and other diseases of the circulatory system: Secondary | ICD-10-CM | POA: Diagnosis not present

## 2014-11-15 DIAGNOSIS — M1990 Unspecified osteoarthritis, unspecified site: Secondary | ICD-10-CM | POA: Diagnosis not present

## 2014-11-15 DIAGNOSIS — Z823 Family history of stroke: Secondary | ICD-10-CM | POA: Insufficient documentation

## 2014-11-15 DIAGNOSIS — Z87891 Personal history of nicotine dependence: Secondary | ICD-10-CM | POA: Diagnosis not present

## 2014-11-15 DIAGNOSIS — Z801 Family history of malignant neoplasm of trachea, bronchus and lung: Secondary | ICD-10-CM | POA: Diagnosis not present

## 2014-11-15 DIAGNOSIS — E1122 Type 2 diabetes mellitus with diabetic chronic kidney disease: Secondary | ICD-10-CM | POA: Insufficient documentation

## 2014-11-15 DIAGNOSIS — I129 Hypertensive chronic kidney disease with stage 1 through stage 4 chronic kidney disease, or unspecified chronic kidney disease: Secondary | ICD-10-CM | POA: Insufficient documentation

## 2014-11-15 DIAGNOSIS — Z8489 Family history of other specified conditions: Secondary | ICD-10-CM | POA: Diagnosis not present

## 2014-11-15 DIAGNOSIS — Z79899 Other long term (current) drug therapy: Secondary | ICD-10-CM | POA: Diagnosis not present

## 2014-11-15 DIAGNOSIS — Z833 Family history of diabetes mellitus: Secondary | ICD-10-CM | POA: Insufficient documentation

## 2014-11-15 DIAGNOSIS — I4891 Unspecified atrial fibrillation: Secondary | ICD-10-CM | POA: Insufficient documentation

## 2014-11-15 DIAGNOSIS — Z7901 Long term (current) use of anticoagulants: Secondary | ICD-10-CM | POA: Diagnosis not present

## 2014-11-15 DIAGNOSIS — E669 Obesity, unspecified: Secondary | ICD-10-CM | POA: Diagnosis not present

## 2014-11-15 DIAGNOSIS — Z808 Family history of malignant neoplasm of other organs or systems: Secondary | ICD-10-CM | POA: Insufficient documentation

## 2014-11-15 HISTORY — PX: BASCILIC VEIN TRANSPOSITION: SHX5742

## 2014-11-15 LAB — TYPE AND SCREEN
ABO/RH(D): A POS
Antibody Screen: NEGATIVE

## 2014-11-15 LAB — POTASSIUM: POTASSIUM: 4.5 mmol/L (ref 3.5–5.1)

## 2014-11-15 LAB — GLUCOSE, CAPILLARY
GLUCOSE-CAPILLARY: 90 mg/dL (ref 65–99)
Glucose-Capillary: 90 mg/dL (ref 65–99)

## 2014-11-15 SURGERY — TRANSPOSITION, VEIN, BASILIC
Anesthesia: General | Laterality: Left | Wound class: Clean

## 2014-11-15 MED ORDER — PHENYLEPHRINE HCL 10 MG/ML IJ SOLN
INTRAMUSCULAR | Status: DC | PRN
  Administered 2014-11-15 (×13): 100 ug via INTRAVENOUS

## 2014-11-15 MED ORDER — HEPARIN SOD (PORK) LOCK FLUSH 10 UNIT/ML IV SOLN
INTRAVENOUS | Status: DC | PRN
  Administered 2014-11-15: .5 mL

## 2014-11-15 MED ORDER — HYDROMORPHONE HCL 1 MG/ML IJ SOLN
0.2500 mg | INTRAMUSCULAR | Status: DC | PRN
  Administered 2014-11-15 (×4): 0.5 mg via INTRAVENOUS

## 2014-11-15 MED ORDER — SODIUM CHLORIDE 0.9 % IV SOLN
INTRAVENOUS | Status: DC
  Administered 2014-11-15 (×2): via INTRAVENOUS

## 2014-11-15 MED ORDER — SUCCINYLCHOLINE CHLORIDE 20 MG/ML IJ SOLN
INTRAMUSCULAR | Status: DC | PRN
  Administered 2014-11-15: 100 mg via INTRAVENOUS

## 2014-11-15 MED ORDER — LIDOCAINE HCL (CARDIAC) 20 MG/ML IV SOLN
INTRAVENOUS | Status: DC | PRN
  Administered 2014-11-15: 50 mg via INTRAVENOUS

## 2014-11-15 MED ORDER — FENTANYL CITRATE (PF) 100 MCG/2ML IJ SOLN
INTRAMUSCULAR | Status: DC | PRN
  Administered 2014-11-15 (×2): 50 ug via INTRAVENOUS
  Administered 2014-11-15: 100 ug via INTRAVENOUS
  Administered 2014-11-15: 50 ug via INTRAVENOUS

## 2014-11-15 MED ORDER — ONDANSETRON HCL 4 MG/2ML IJ SOLN
INTRAMUSCULAR | Status: DC | PRN
Start: 2014-11-15 — End: 2014-11-15
  Administered 2014-11-15: 4 mg via INTRAVENOUS

## 2014-11-15 MED ORDER — KETAMINE HCL 50 MG/ML IJ SOLN
INTRAMUSCULAR | Status: DC | PRN
  Administered 2014-11-15 (×2): 25 mg via INTRAMUSCULAR

## 2014-11-15 MED ORDER — CIPROFLOXACIN IN D5W 400 MG/200ML IV SOLN
400.0000 mg | Freq: Once | INTRAVENOUS | Status: AC
  Administered 2014-11-15: 400 mg via INTRAVENOUS

## 2014-11-15 MED ORDER — CIPROFLOXACIN IN D5W 400 MG/200ML IV SOLN
INTRAVENOUS | Status: AC
  Administered 2014-11-15: 400 mg via INTRAVENOUS
  Filled 2014-11-15: qty 200

## 2014-11-15 MED ORDER — HYDROMORPHONE HCL 1 MG/ML IJ SOLN
INTRAMUSCULAR | Status: AC
  Filled 2014-11-15: qty 1

## 2014-11-15 MED ORDER — PROPOFOL 10 MG/ML IV BOLUS
INTRAVENOUS | Status: DC | PRN
  Administered 2014-11-15: 40 mg via INTRAVENOUS
  Administered 2014-11-15: 160 mg via INTRAVENOUS

## 2014-11-15 MED ORDER — MIDAZOLAM HCL 2 MG/2ML IJ SOLN
INTRAMUSCULAR | Status: DC | PRN
  Administered 2014-11-15: 2 mg via INTRAVENOUS

## 2014-11-15 MED ORDER — EPHEDRINE SULFATE 50 MG/ML IJ SOLN
INTRAMUSCULAR | Status: DC | PRN
  Administered 2014-11-15 (×5): 10 mg via INTRAVENOUS

## 2014-11-15 MED ORDER — CLINDAMYCIN PHOSPHATE 300 MG/50ML IV SOLN: 300.0000 mg | Freq: Once | INTRAVENOUS | Status: DC

## 2014-11-15 MED ORDER — HEPARIN SODIUM (PORCINE) 5000 UNIT/ML IJ SOLN
INTRAMUSCULAR | Status: AC
  Filled 2014-11-15: qty 1

## 2014-11-15 MED ORDER — BUPIVACAINE HCL (PF) 0.5 % IJ SOLN
INTRAMUSCULAR | Status: AC
  Filled 2014-11-15: qty 30

## 2014-11-15 MED ORDER — ONDANSETRON HCL 4 MG/2ML IJ SOLN: 4.0000 mg | Freq: Once | INTRAMUSCULAR | Status: DC | PRN

## 2014-11-15 SURGICAL SUPPLY — 51 items
APPLIER CLIP 11 MED OPEN (CLIP)
APPLIER CLIP 9.375 SM OPEN (CLIP)
BAG DECANTER STRL (MISCELLANEOUS) ×3 IMPLANT
BLADE SURG SZ11 CARB STEEL (BLADE) ×3 IMPLANT
BOOT SUTURE AID YELLOW STND (SUTURE) ×3 IMPLANT
BRUSH SCRUB 4% CHG (MISCELLANEOUS) ×3 IMPLANT
CANISTER SUCT 1200ML W/VALVE (MISCELLANEOUS) ×3 IMPLANT
CHLORAPREP W/TINT 26ML (MISCELLANEOUS) ×3 IMPLANT
CLIP APPLIE 11 MED OPEN (CLIP) IMPLANT
CLIP APPLIE 9.375 SM OPEN (CLIP) IMPLANT
ELECT CAUTERY BLADE 6.4 (BLADE) ×3 IMPLANT
GEL ULTRASOUND 20GR AQUASONIC (MISCELLANEOUS) IMPLANT
GLOVE SURG SYN 8.0 (GLOVE) ×3 IMPLANT
GOWN STRL REUS W/ TWL LRG LVL3 (GOWN DISPOSABLE) ×2 IMPLANT
GOWN STRL REUS W/ TWL XL LVL3 (GOWN DISPOSABLE) ×1 IMPLANT
GOWN STRL REUS W/TWL LRG LVL3 (GOWN DISPOSABLE) ×4
GOWN STRL REUS W/TWL XL LVL3 (GOWN DISPOSABLE) ×2
HEMOSTAT SURGICEL 2X3 (HEMOSTASIS) ×3 IMPLANT
IV NS 500ML (IV SOLUTION) ×2
IV NS 500ML BAXH (IV SOLUTION) ×1 IMPLANT
KIT RM TURNOVER STRD PROC AR (KITS) ×3 IMPLANT
LABEL OR SOLS (LABEL) ×3 IMPLANT
LIQUID BAND (GAUZE/BANDAGES/DRESSINGS) ×6 IMPLANT
LOOP RED MAXI  1X406MM (MISCELLANEOUS) ×2
LOOP VESSEL MAXI 1X406 RED (MISCELLANEOUS) ×1 IMPLANT
LOOP VESSEL MINI 0.8X406 BLUE (MISCELLANEOUS) ×1 IMPLANT
LOOPS BLUE MINI 0.8X406MM (MISCELLANEOUS) ×2
NEEDLE FILTER BLUNT 18X 1/2SAF (NEEDLE) ×2
NEEDLE FILTER BLUNT 18X1 1/2 (NEEDLE) ×1 IMPLANT
NEEDLE HYPO 30X.5 LL (NEEDLE) IMPLANT
NS IRRIG 500ML POUR BTL (IV SOLUTION) ×3 IMPLANT
PACK EXTREMITY ARMC (MISCELLANEOUS) ×3 IMPLANT
PAD GROUND ADULT SPLIT (MISCELLANEOUS) ×3 IMPLANT
PAD PREP 24X41 OB/GYN DISP (PERSONAL CARE ITEMS) ×3 IMPLANT
PUNCH SURGICAL ROTATE 2.7MM (MISCELLANEOUS) IMPLANT
STOCKINETTE STRL 4IN 9604848 (GAUZE/BANDAGES/DRESSINGS) ×3 IMPLANT
SUT MNCRL+ 5-0 UNDYED PC-3 (SUTURE) ×1 IMPLANT
SUT MONOCRYL 5-0 (SUTURE) ×2
SUT PROLENE 6 0 BV (SUTURE) ×12 IMPLANT
SUT SILK 2 0 (SUTURE) ×4
SUT SILK 2 0 SH (SUTURE) IMPLANT
SUT SILK 2-0 18XBRD TIE 12 (SUTURE) ×2 IMPLANT
SUT SILK 3 0 (SUTURE) ×4
SUT SILK 3-0 18XBRD TIE 12 (SUTURE) ×2 IMPLANT
SUT SILK 4 0 (SUTURE) ×2
SUT SILK 4-0 18XBRD TIE 12 (SUTURE) ×1 IMPLANT
SUT VIC AB 3-0 SH 27 (SUTURE) ×6
SUT VIC AB 3-0 SH 27X BRD (SUTURE) ×3 IMPLANT
SYR 20CC LL (SYRINGE) ×3 IMPLANT
SYR 3ML LL SCALE MARK (SYRINGE) ×3 IMPLANT
TOWEL OR 17X26 4PK STRL BLUE (TOWEL DISPOSABLE) IMPLANT

## 2014-11-15 NOTE — H&P (Signed)
[facility] History & Physical Update  The patient was interviewed and re-examined.  The patient's previous History and Physical has been reviewed and is unchanged.  There is no change in the plan of care. We plan to proceed with the scheduled procedure.  Schnier, Dolores Lory, MD  11/15/2014, 12:58 PM

## 2014-11-15 NOTE — Discharge Instructions (Signed)

## 2014-11-15 NOTE — Anesthesia Postprocedure Evaluation (Signed)
  Anesthesia Post-op Note  Patient: Andrea Vega  Procedure(s) Performed: Procedure(s): BASCILIC VEIN TRANSPOSITION (Left)  Anesthesia type:General  Patient location: PACU  Post pain: Pain level controlled  Post assessment: Post-op Vital signs reviewed, Patient's Cardiovascular Status Stable, Respiratory Function Stable, Patent Airway and No signs of Nausea or vomiting  Post vital signs: Reviewed and stable  Last Vitals:  Filed Vitals:   11/15/14 1050  BP: 101/52  Pulse: 90  Temp:   Resp: 16    Level of consciousness: awake, alert  and patient cooperative  Complications: No apparent anesthesia complications

## 2014-11-15 NOTE — Anesthesia Preprocedure Evaluation (Signed)
Anesthesia Evaluation  Patient identified by MRN, date of birth, ID band Patient awake    Reviewed: Allergy & Precautions, NPO status , Patient's Chart, lab work & pertinent test results  Airway Mallampati: II  TM Distance: >3 FB Neck ROM: Limited    Dental  (+) Missing   Pulmonary sleep apnea and Continuous Positive Airway Pressure Ventilation , former smoker,  OSA, using Bipap. breath sounds clear to auscultation        Cardiovascular Exercise Tolerance: Poor hypertension, Pt. on medications + dysrhythmias Atrial Fibrillation Rhythm:Regular Rate:Normal     Neuro/Psych    GI/Hepatic GERD-  Medicated and Controlled,  Endo/Other  diabetes, Type 2  Renal/GU CRFRenal disease     Musculoskeletal  (+) Arthritis -, Osteoarthritis,    Abdominal (+) + obese,  Abdomen: soft.    Peds  Hematology  (+) anemia ,   Anesthesia Other Findings   Reproductive/Obstetrics                             Anesthesia Physical Anesthesia Plan  ASA: IV  Anesthesia Plan: General   Post-op Pain Management:    Induction: Intravenous  Airway Management Planned: Oral ETT  Additional Equipment:   Intra-op Plan:   Post-operative Plan: Extubation in OR  Informed Consent: I have reviewed the patients History and Physical, chart, labs and discussed the procedure including the risks, benefits and alternatives for the proposed anesthesia with the patient or authorized representative who has indicated his/her understanding and acceptance.     Plan Discussed with: CRNA  Anesthesia Plan Comments:         Anesthesia Quick Evaluation

## 2014-11-15 NOTE — Anesthesia Procedure Notes (Signed)
Procedure Name: Intubation Date/Time: 11/15/2014 7:43 AM Performed by: Rolla Plate Pre-anesthesia Checklist: Patient identified, Patient being monitored, Timeout performed, Emergency Drugs available and Suction available Patient Re-evaluated:Patient Re-evaluated prior to inductionOxygen Delivery Method: Circle system utilized Preoxygenation: Pre-oxygenation with 100% oxygen Intubation Type: IV induction and Rapid sequence Laryngoscope Size: Miller and 2 Grade View: Grade I Tube type: Oral Tube size: 7.0 mm Number of attempts: 1 Placement Confirmation: ETT inserted through vocal cords under direct vision,  positive ETCO2 and breath sounds checked- equal and bilateral Secured at: 20 cm Tube secured with: Tape Dental Injury: Teeth and Oropharynx as per pre-operative assessment

## 2014-11-15 NOTE — Op Note (Signed)
   OPERATIVE NOTE   PROCEDURE: left  basilic vein transposition (brachiobasilic arteriovenous fistula) placement  PRE-OPERATIVE DIAGNOSIS: Stage IV renal insufficiency  POST-OPERATIVE DIAGNOSIS: Same  SURGEON: Katha Cabal, M.D.  ASSISTANT(S): Ms. Hezzie Bump  ANESTHESIA: general  ESTIMATED BLOOD LOSS: Minimal cc  FINDING(S): Basilic vein of good caliber  SPECIMEN(S):  None  INDICATIONS:   Andrea Vega is a 64 y.o. female who presents with stage IV renal insufficiency. She is therefore undergoing creation of a fistula so that she will have adequate dialysis access in the future and avoid catheter placement..  The patient is scheduled for left second stage basilic vein transposition.  The patient is aware the risks include but are not limited to: bleeding, infection, steal syndrome, nerve damage, failure to mature, and need for additional procedures.  The patient is aware of the risks of the procedure and elects to proceed forward.  DESCRIPTION: After full informed written consent was obtained from the patient, the patient was brought back to the operating room and placed supine upon the operating table.  Prior to induction, the patient received IV antibiotics.   After obtaining adequate anesthesia, the patient's left arm was then prepped and draped in the standard fashion. Appropriate timeout is called  Preoperative vein mapping indicates a basilic vein which is 4 mm or larger throughout its course and therefore basilic transposition will be performed in a single stage.  I turned my attention first to identifying the patient's basilic vein.  I made an longitudinal incision over the vein from the level of the antecubital fossa up to its axillary extent.  I carefully dissected the vein away from its adjacent nerves.  The entirety of the basilic vein was mobilized and the vein was then marked with a surgical marker to maintain appropriate orientation. It was then irrigated with  heparinized saline.   A plane on top of the biceps fascia was dissected with electrocautery. The vein was then approximated to the skin in order to plan for the position of the arterial anastomosis.  The deep subcutaneous tissue was inspected for bleeding.  The basilic vein was then pulled subcutaneously using a Kelly clamp after a small counterincision was made at the apex of the tunnel.   The brachial artery is then dissected and looped with Silastic Vessel loops. Brachial artery is then controlled with the vessel loops and arteriotomy is made with an 11 blade and extended with Potts scissors and 6-0 Prolene stay sutures were placed. End vein to side brachial artery anastomosis is then fashioned with running 6-0 Prolene. Flushing maneuvers performed and flow was established through the fistula. Excellent thrill is noted within the vein 2+ radial pulses maintained at the wrist.   Bleeding was controlled with electrocautery and placement of large pieces of thrombin and gelfoam.  I washed out the surgical site after waiting a few minutes, and there was no further bleeding.  The fascia was reapproximated with interrupted stitches of 3-0 Vicryl to eliminate some of the deep space.  The superficial subcutaneous tissue was then reapproximated along the incision line with a running stitch of 3-0 Vicryl.  The skin was then reapproximated with a running subcuticular of 4-0 Monocryl.  The skin was then cleaned, dried, and reinforced with Dermabond.    The patient tolerated this procedure well.   COMPLICATIONS: None  CONDITION: Andrea Vega  11/15/2014, 9:46 AM

## 2014-11-15 NOTE — Transfer of Care (Signed)
Immediate Anesthesia Transfer of Care Note  Patient: Andrea Vega  Procedure(s) Performed: Procedure(s): BASCILIC VEIN TRANSPOSITION (Left)  Patient Location: PACU  Anesthesia Type:General  Level of Consciousness: awake and alert   Airway & Oxygen Therapy: Patient Spontanous Breathing and Patient connected to face mask oxygen  Post-op Assessment: Report given to RN  Post vital signs: Reviewed  Last Vitals:  Filed Vitals:   11/15/14 1021  BP: 126/63  Pulse: 97  Temp: 36.7 C  Resp: 23    Complications: No apparent anesthesia complications

## 2014-11-18 ENCOUNTER — Encounter: Payer: Medicare Other | Admitting: Surgery

## 2014-11-27 ENCOUNTER — Encounter: Payer: Medicare Other | Admitting: Surgery

## 2014-11-28 ENCOUNTER — Encounter: Payer: Medicare Other | Admitting: Surgery

## 2014-12-05 ENCOUNTER — Encounter: Payer: Self-pay | Admitting: Surgery

## 2014-12-05 ENCOUNTER — Ambulatory Visit (INDEPENDENT_AMBULATORY_CARE_PROVIDER_SITE_OTHER): Payer: Medicare Other | Admitting: Surgery

## 2014-12-05 VITALS — BP 161/70 | HR 68 | Temp 98.9°F | Ht 66.0 in | Wt 213.4 lb

## 2014-12-05 DIAGNOSIS — T814XXD Infection following a procedure, subsequent encounter: Secondary | ICD-10-CM

## 2014-12-05 DIAGNOSIS — IMO0001 Reserved for inherently not codable concepts without codable children: Secondary | ICD-10-CM

## 2014-12-05 NOTE — Patient Instructions (Signed)
Discontinue packing and placed Band-Aid only on the left flank wound.  Follow-up in the office with Dr. Burt Knack in 10 days.

## 2014-12-05 NOTE — Progress Notes (Signed)
Outpatient postop visit  12/05/2014  Andrea Vega is an 64 y.o. female.    Procedure: I&D of abdominal wall abscess  CC: Abdominal wall abscess  HPI: This patient status post I&D of an abdominal wall abscess she's been seen multiple times in the office for dressing changes.  Medications reviewed.    Physical Exam:  There were no vitals taken for this visit.    PE: Morbidly obese no acute distress, wound is granulating in very shallow at this point silver nitrate is applied    Assessment/Plan:  Band-Aid placed after silver nitrate was applied to the skin edges no need for further packing at this point. We'll see back in 10 days retreat with silver nitrate and probably discharge from the clinic at that point.  Patient asked about colostomy closure and I can discuss that with her in the future.  Florene Glen, MD, FACS

## 2014-12-16 ENCOUNTER — Encounter: Payer: Medicare Other | Admitting: Surgery

## 2014-12-16 LAB — PHOSPHORUS: Phosphorus: 6 mg/dL — ABNORMAL HIGH

## 2014-12-16 LAB — POTASSIUM: POTASSIUM: 4.5 mmol/L

## 2014-12-18 ENCOUNTER — Encounter: Payer: Self-pay | Admitting: Surgery

## 2014-12-18 ENCOUNTER — Ambulatory Visit (INDEPENDENT_AMBULATORY_CARE_PROVIDER_SITE_OTHER): Payer: Medicare Other | Admitting: Surgery

## 2014-12-18 VITALS — BP 150/66 | HR 66 | Temp 98.6°F | Ht 66.5 in | Wt 214.0 lb

## 2014-12-18 DIAGNOSIS — IMO0001 Reserved for inherently not codable concepts without codable children: Secondary | ICD-10-CM

## 2014-12-18 DIAGNOSIS — T814XXD Infection following a procedure, subsequent encounter: Secondary | ICD-10-CM

## 2014-12-18 NOTE — Progress Notes (Signed)
Outpatient postop visit  12/18/2014  Andrea Vega is an 64 y.o. female.    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  Medications reviewed.    Physical Exam:  BP 150/66 mmHg  Pulse 66  Temp(Src) 98.6 F (37 C) (Oral)  Ht 5' 6.5" (1.689 m)  Wt 214 lb (97.07 kg)  BMI 34.03 kg/m2    PE: Wound is completely closed at this point no erythema no drainage midline wound is soft and nontender ostomy is functional. Calves are nontender    Assessment/Plan:  This patient wants to discuss colostomy closure now the left flank wound is closed. I did a thorough review of her history and charting. She saw Dr. Ma Hillock and has an appointment with him in September. A PET scan done in June showed postoperative changes only without obvious malignancy. Of note however on pathology she had perforation of a rather large tumor with positive nodes and likely peritoneal extension and abscess formation. Date she has declined any further therapy including chemotherapy.  I reviewed for her the fact that in a patient with a strong likelihood of having recurrent tumor in the future especially in the left side where an anastomosis would be performed her tumor would recur would be contraindicated for closure of her colostomy. An anastomotic recurrence or direct extension would be very problematic for her especially since she underwent sepsis dialysis and complications of her diabetes and obesity with her prior surgery.  I would suggest that she keep her appointment with Dr. Ma Hillock and follow-up with me in October and we can discuss the possibility of closure based on her risk factors.  Florene Glen, MD, FACS

## 2014-12-18 NOTE — Patient Instructions (Signed)
No further care needed to left lateral wound which is completely healed. Follow-up with Dr. Loni Muse in September and with Dr. Burt Knack in October.

## 2015-01-10 ENCOUNTER — Other Ambulatory Visit: Payer: Medicare Other

## 2015-01-10 ENCOUNTER — Ambulatory Visit: Payer: Medicare Other | Admitting: Internal Medicine

## 2015-01-14 ENCOUNTER — Inpatient Hospital Stay (HOSPITAL_BASED_OUTPATIENT_CLINIC_OR_DEPARTMENT_OTHER): Payer: Medicare Other | Admitting: Internal Medicine

## 2015-01-14 ENCOUNTER — Inpatient Hospital Stay: Payer: Medicare Other | Attending: Internal Medicine

## 2015-01-14 VITALS — BP 146/76 | HR 60 | Temp 97.9°F | Resp 18 | Ht 66.5 in | Wt 218.0 lb

## 2015-01-14 DIAGNOSIS — K769 Liver disease, unspecified: Secondary | ICD-10-CM | POA: Diagnosis not present

## 2015-01-14 DIAGNOSIS — D649 Anemia, unspecified: Secondary | ICD-10-CM

## 2015-01-14 DIAGNOSIS — C189 Malignant neoplasm of colon, unspecified: Secondary | ICD-10-CM | POA: Diagnosis not present

## 2015-01-14 DIAGNOSIS — Z9049 Acquired absence of other specified parts of digestive tract: Secondary | ICD-10-CM

## 2015-01-14 DIAGNOSIS — M479 Spondylosis, unspecified: Secondary | ICD-10-CM | POA: Diagnosis not present

## 2015-01-14 DIAGNOSIS — E669 Obesity, unspecified: Secondary | ICD-10-CM | POA: Diagnosis not present

## 2015-01-14 DIAGNOSIS — K760 Fatty (change of) liver, not elsewhere classified: Secondary | ICD-10-CM | POA: Insufficient documentation

## 2015-01-14 DIAGNOSIS — D3502 Benign neoplasm of left adrenal gland: Secondary | ICD-10-CM | POA: Insufficient documentation

## 2015-01-14 DIAGNOSIS — K219 Gastro-esophageal reflux disease without esophagitis: Secondary | ICD-10-CM | POA: Diagnosis not present

## 2015-01-14 DIAGNOSIS — Z87891 Personal history of nicotine dependence: Secondary | ICD-10-CM | POA: Diagnosis not present

## 2015-01-14 DIAGNOSIS — F17210 Nicotine dependence, cigarettes, uncomplicated: Secondary | ICD-10-CM

## 2015-01-14 DIAGNOSIS — Z800 Family history of malignant neoplasm of digestive organs: Secondary | ICD-10-CM | POA: Diagnosis not present

## 2015-01-14 DIAGNOSIS — E785 Hyperlipidemia, unspecified: Secondary | ICD-10-CM | POA: Diagnosis not present

## 2015-01-14 DIAGNOSIS — C7A8 Other malignant neuroendocrine tumors: Secondary | ICD-10-CM | POA: Insufficient documentation

## 2015-01-14 DIAGNOSIS — R05 Cough: Secondary | ICD-10-CM | POA: Insufficient documentation

## 2015-01-14 DIAGNOSIS — R918 Other nonspecific abnormal finding of lung field: Secondary | ICD-10-CM | POA: Diagnosis not present

## 2015-01-14 DIAGNOSIS — R160 Hepatomegaly, not elsewhere classified: Secondary | ICD-10-CM

## 2015-01-14 DIAGNOSIS — M129 Arthropathy, unspecified: Secondary | ICD-10-CM

## 2015-01-14 DIAGNOSIS — N189 Chronic kidney disease, unspecified: Secondary | ICD-10-CM | POA: Insufficient documentation

## 2015-01-14 DIAGNOSIS — E119 Type 2 diabetes mellitus without complications: Secondary | ICD-10-CM | POA: Diagnosis not present

## 2015-01-14 DIAGNOSIS — I129 Hypertensive chronic kidney disease with stage 1 through stage 4 chronic kidney disease, or unspecified chronic kidney disease: Secondary | ICD-10-CM | POA: Insufficient documentation

## 2015-01-14 DIAGNOSIS — J90 Pleural effusion, not elsewhere classified: Secondary | ICD-10-CM | POA: Insufficient documentation

## 2015-01-14 LAB — CBC WITH DIFFERENTIAL/PLATELET
BASOS PCT: 1 %
Basophils Absolute: 0 10*3/uL (ref 0–0.1)
EOS PCT: 2 %
Eosinophils Absolute: 0.2 10*3/uL (ref 0–0.7)
HCT: 33.6 % — ABNORMAL LOW (ref 35.0–47.0)
HEMOGLOBIN: 11.1 g/dL — AB (ref 12.0–16.0)
Lymphocytes Relative: 18 %
Lymphs Abs: 1.7 10*3/uL (ref 1.0–3.6)
MCH: 29.5 pg (ref 26.0–34.0)
MCHC: 33.1 g/dL (ref 32.0–36.0)
MCV: 89.2 fL (ref 80.0–100.0)
MONOS PCT: 7 %
Monocytes Absolute: 0.6 10*3/uL (ref 0.2–0.9)
NEUTROS PCT: 72 %
Neutro Abs: 6.7 10*3/uL — ABNORMAL HIGH (ref 1.4–6.5)
PLATELETS: 225 10*3/uL (ref 150–440)
RBC: 3.76 MIL/uL — ABNORMAL LOW (ref 3.80–5.20)
RDW: 15.1 % — AB (ref 11.5–14.5)
WBC: 9.3 10*3/uL (ref 3.6–11.0)

## 2015-01-14 LAB — CREATININE, SERUM
CREATININE: 2.69 mg/dL — AB (ref 0.44–1.00)
GFR calc Af Amer: 21 mL/min — ABNORMAL LOW (ref >60)
GFR, EST NON AFRICAN AMERICAN: 18 mL/min — AB (ref >60)

## 2015-01-14 LAB — HEPATIC FUNCTION PANEL
ALBUMIN: 4.2 g/dL (ref 3.5–5.0)
ALT: 19 U/L (ref 14–54)
AST: 19 U/L (ref 15–41)
Alkaline Phosphatase: 93 U/L (ref 38–126)
BILIRUBIN TOTAL: 0.4 mg/dL (ref 0.3–1.2)
Total Protein: 7.7 g/dL (ref 6.5–8.1)

## 2015-01-14 NOTE — Progress Notes (Signed)
Patient is here for follow-up. She states that overall she has been doing pretty good. She states that she does have pain in her right hip that gives her trouble when she walks. She denies any other pain and states that her appetite has been good.

## 2015-01-15 LAB — CEA (SERIAL MONITOR): CEA: 48.2 ng/mL — ABNORMAL HIGH (ref 0.0–4.7)

## 2015-01-16 ENCOUNTER — Other Ambulatory Visit: Payer: Self-pay | Admitting: Internal Medicine

## 2015-01-16 ENCOUNTER — Telehealth: Payer: Self-pay | Admitting: *Deleted

## 2015-01-16 DIAGNOSIS — C189 Malignant neoplasm of colon, unspecified: Secondary | ICD-10-CM

## 2015-01-16 NOTE — Telephone Encounter (Signed)
CEA 0.0 - 4.7 ng/mL 48.2 (H) 14.2 (H)CM       Asking for results, please call her

## 2015-01-16 NOTE — Telephone Encounter (Signed)
Called patient and received voicemail again. Left message and patient called me back. I informed her that her CEA has increased from 3 months ago to 48.2. I informed her that Dr. Ma Hillock would like to get CT Scan of Chest, Abd, and Pelvis to evaluate for any recurrent/metastatic disease. Patient states that she is agreeable to CT Scan so I will have Dr. Ma Hillock enter order and get scheduling to schedule and notify patient.

## 2015-01-16 NOTE — Progress Notes (Signed)
[facility]  [phone] Fax:([phone]) [phone]     ID: [name] OB: January 03, 1951  MR#: [id number]  ZHY#:[id number]  Patient Care Team: Lavonne Chick, MD as PCP - General (Family Medicine)  CHIEF COMPLAINT/DIAGNOSIS:  Stage IIIB (pT4a pN1a cM0) left-sided adenocarcinoma of the colon status post partial colectomy and lymph node dissection on 07/11/2014. 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   (Hospitalized 07/10/2014 with abdominal pain, CT scan of the abdomen and pelvis reported bowel obstruction caused by masslike thickening in the descending colon, stable left adrenal adenoma, diffuse hepatic steatosis, lumbar spondylosis, and degenerative disk disease. The patient then underwent partial colectomy on the left side, appendectomy, and colostomy on March 3 with pathology reporting tumor penetrating the visceral peritoneum with pT4a and pN1a with 1 of 13 regional lymph nodes positive for metastasis. 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   10/14/14 - serum CEA is 14.2.  10/16/14 - PET scan. IMPRESSION: 1. Hypermetabolic loculated fluid adjacent to the spleen may be due to residual abscesses. Peritoneal carcinomatosis is not excluded. 2. Hypermetabolic nodularity, haziness and soft tissue thickening along the left abdomen, as described above, possibly postoperative in nature. Peritoneal carcinomatosis can also have this appearance. 3. Hypermetabolism deep to the rectus abdominus musculature may be postoperative in etiology. 4. Mildly hypermetabolic low right paratracheal lymph node. 5. Moderate left pleural effusion with compressive atelectasis in the left lower lobe. 6. Left adrenal adenoma.  Patient refused adjuvant chemotherapy.   HISTORY OF PRESENT ILLNESS:  Patient returns for oncology followup with repeat CEA level. States she is doing fairly steady, eating better. States  energy levels are slowly improving. Has mild intermittent cough, denies hemoptysis or chest pain. Denies any major abdominal or pelvic pain. States her bowel movements are doing fairly steady, no blood in stools or urine.   REVIEW OF SYSTEMS:   ROS As in HPI above. In addition, no fevers. No new headaches or focal weakness.  No new sore throat or dysphagia. No new bone pains. States that she has chronic tingling and numbness in hands and feet from diabetes. No polyuria polydipsia. PS ECOG 2.  PAST MEDICAL HISTORY: Reviewed. Past Medical History  Diagnosis Date  . Atrial flutter     a. post op and in the setting of sepsis; b. not on long term anticoagulation; c. echo 07/2014: EF 55-60%, technically difficult study  . Obesity   . GERD (gastroesophageal reflux disease)   . HTN (hypertension)   . Colon cancer 10/31/2014  . Diabetes mellitus     diet control  . Elevated lipids   . Chronic kidney disease   . Arthritis     Rt hip  . Open wound     Lt lateral side  Diabetes, hypertension, obesity, tobacco abuse, depression. Cholecystectomy December 2015 Hospitalized 07/10/2014 with abdominal pain, CT scan of the abdomen and pelvis reported bowel obstruction caused by masslike thickening in the descending colon, stable left adrenal adenoma, diffuse hepatic steatosis, lumbar spondylosis, and degenerative disk disease. The patient then underwent partial colectomy on the left side, appendectomy, and colostomy on March 3 with pathology reporting tumor penetrating the visceral peritoneum with pT4a and pN1a with 1 of 13 regional lymph nodes positive for metastasis. 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   PAST SURGICAL HISTORY: Reviewed. Past Surgical History  Procedure Laterality Date  . Colon surgery    . Cholecystectomy    .  Wound debridement Left 10/19/2014    Procedure: DEBRIDEMENT ABDOMINAL WOUND;  Surgeon: Molly Maduro, MD;  Location: [facility];   Service: General;  Laterality: Left;  . Peripheral vascular catheterization N/A 11/05/2014    Procedure: Dialysis/Perma Catheter Removal;  Surgeon: Katha Cabal, MD;  Location: [facility];  Service: Cardiovascular;  Laterality: N/A;  . Bascilic vein transposition Left 11/15/2014    Procedure: BASCILIC VEIN TRANSPOSITION;  Surgeon: Katha Cabal, MD;  Location: [facility];  Service: Vascular;  Laterality: Left;    FAMILY HISTORY: Reviewed. Family History  Problem Relation Age of Onset  . Colon cancer Mother   . Diabetes Mother   . Hypertension Mother   . Lung cancer Father   . Lung cancer Paternal Grandfather     ADVANCED DIRECTIVES:  No - patient declined information  SOCIAL HISTORY: Reviewed. Social History  Substance Use Topics  . Smoking status: Former Smoker    Quit date: 07/10/2014  . Smokeless tobacco: Never Used  . Alcohol Use: No    Allergies  Allergen Reactions  . Lorazepam Shortness Of Breath    Reaction:  severe   . Morphine And Related Nausea And Vomiting    Reaction: unknown  . Penicillins Hives  . Zosyn [Piperacillin Sod-Tazobactam So] Other (See Comments)    Reaction: unknown  . Clindamycin/Lincomycin Rash    Current Outpatient Prescriptions  Medication Sig Dispense Refill  . amiodarone (PACERONE) 100 MG tablet Take 100 mg by mouth daily.    Marland Kitchen b complex-vitamin c-folic acid (NEPHRO-VITE) 0.8 MG TABS tablet Take 1 tablet by mouth at bedtime.    Marland Kitchen levothyroxine (SYNTHROID, LEVOTHROID) 100 MCG tablet Take by mouth daily before breakfast.    . losartan (COZAAR) 100 MG tablet Take 100 mg by mouth at bedtime.     . OxyCODONE HCl, Abuse Deter, (OXAYDO) 5 MG TABA Take 5 mg by mouth every 8 (eight) hours.    . sertraline (ZOLOFT) 100 MG tablet Take 100 mg by mouth at bedtime.     Marland Kitchen warfarin (COUMADIN) 1 MG tablet Take 1 mg by mouth daily. 4 tabs on Mon, Wed, Fri and 3 tabs on Sat, Sun, Tues, Thurs.    . warfarin (COUMADIN) 5 MG tablet Take 5 mg  by mouth daily.    Marland Kitchen warfarin (COUMADIN) 5 MG tablet Take 5.5 mg by mouth at bedtime.      No current facility-administered medications for this visit.    PHYSICAL EXAM: Filed Vitals:   01/14/15 1409  BP: 146/76  Pulse: 60  Temp: 97.9 F (36.6 C)  Resp: 18     Body mass index is 34.67 kg/(m^2).    ECOG FS:2 - Symptomatic, <50% confined to bed  GENERAL: Alert and oriented and in no acute distress. No icterus. HEENT: EOMs intact. No cervical adenopathy. LUNGS: Bilaterally diminished breath sounds overall especially at bases, no rhonchi. ABDOMEN: Soft, nontender. No hepatomegaly or masses clinically.  EXTREMITIES: Trace pedal edema  LAB RESULTS: Today - Creatinine 2.69, LFT unremarkable, WBC 9.3, hemoglobin 11.1, platelets 225. 01/14/15 - serum CEA 48.2 (was 14.2 on 10/14/14).  STUDIES: 10/03/14 -  CT scan of the abdomen/pelvis. IMPRESSION: 1. Left upper quadrant drain in place with near complete resolution of perisplenic fluid collection. 2. Otherwise, degraded exam secondary to multiple factors detailed above. 3. Decrease in left pleural effusion with adjacent left lower lobe atelectasis or infection. 4. Similar size a left adrenal nodule. 5. Possible constipation. 6. Hepatomegaly and trace ascites.  10/16/14 -  PET scan. IMPRESSION: 1. Hypermetabolic loculated fluid adjacent to the spleen may be due to residual abscesses. Peritoneal carcinomatosis is not excluded. 2. Hypermetabolic nodularity, haziness and soft tissue thickening along the left abdomen, as described above, possibly postoperative in nature. Peritoneal carcinomatosis can also have this appearance. 3. Hypermetabolism deep to the rectus abdominus musculature may be postoperative in etiology. 4. Mildly hypermetabolic low right paratracheal lymph node. 5. Moderate left pleural effusion with compressive atelectasis in the left lower lobe. 6. Left adrenal adenoma.    ASSESSMENT / PLAN:   1. Stage IIIB (pT4a pN1a cM0) left-sided  adenocarcinoma of the colon status post partial colectomy and lymph node dissection on 07/11/2014. 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  (Hospitalized 07/10/14 with abdominal pain, CT scan of the abdomen and pelvis reported bowel obstruction caused by masslike thickening in the descending colon, stable left adrenal adenoma, diffuse hepatic steatosis, lumbar spondylosis, and degenerative disk disease. The patient then underwent partial colectomy on the left side, appendectomy, and colostomy on March 3 with pathology reporting tumor penetrating the visceral peritoneum with pT4a and pN1a with 1 of 13 regional lymph nodes positive for metastasis. 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 There is further elevation in serum CEA and is 48.2 today raising concern for recurrent/metastatic malignancy. Patient will think about this and let us know, if she is agreeable then will proceed with CT scan chest/abdomen/pelvis and see her back after it is done. She has already made decision not to pursue any kind of adjuvant chemotherapy including milder regimen like oral Xeloda due to her poor performance status and co-morbidities and understands the risks of not taking adjuvant chemotherapy including recurrent/progressive malignancy and possible death from recurrent cancer. 2. Anemia - likely secondary to renal insufficiency and recent surgeries. Hb is improving. Patient does not do much physical activity and does not want to consider ESA therapy like Procrit at this time.       3. In between visits, the patient has been advised to call or come to the ER in case of fevers, bleeding, acute sickness or new symptoms. Patient is agreeable to this plan.      Leia Alf, MD   01/16/2015 12:16 PM

## 2015-01-20 ENCOUNTER — Ambulatory Visit
Admission: RE | Admit: 2015-01-20 | Discharge: 2015-01-20 | Disposition: A | Discharge status: Home / Self Care | Payer: Medicare Other | Source: Ambulatory Visit | Attending: Internal Medicine | Admitting: Internal Medicine

## 2015-01-20 DIAGNOSIS — J90 Pleural effusion, not elsewhere classified: Secondary | ICD-10-CM | POA: Diagnosis not present

## 2015-01-20 DIAGNOSIS — K469 Unspecified abdominal hernia without obstruction or gangrene: Secondary | ICD-10-CM | POA: Insufficient documentation

## 2015-01-20 DIAGNOSIS — Z85038 Personal history of other malignant neoplasm of large intestine: Secondary | ICD-10-CM | POA: Diagnosis present

## 2015-01-20 DIAGNOSIS — R911 Solitary pulmonary nodule: Secondary | ICD-10-CM | POA: Diagnosis not present

## 2015-01-20 DIAGNOSIS — C189 Malignant neoplasm of colon, unspecified: Secondary | ICD-10-CM

## 2015-01-21 ENCOUNTER — Inpatient Hospital Stay: Payer: Medicare Other | Admitting: Internal Medicine

## 2015-01-21 VITALS — BP 119/57 | HR 65 | Temp 97.8°F | Resp 18 | Ht 66.6 in | Wt 204.1 lb

## 2015-01-21 DIAGNOSIS — C189 Malignant neoplasm of colon, unspecified: Secondary | ICD-10-CM

## 2015-01-21 NOTE — Progress Notes (Signed)
Recent CEA has increased to 48. CT scan of the chest/abdomen/pelvis reports peritoneal nodularity along with small lung nodules raising suspicion for recurrent/metastatic colon cancer. Have discussed scan results with patient and family (daughter present), patient states that she would be interested in taking cancer treatment including chemotherapy if she definitely has recurrence. Will pursue PET scan for restaging, consider biopsy if indicated and then make further plan of management.

## 2015-01-21 NOTE — Progress Notes (Signed)
Patient is here for follow-up of CT Scan results. Patient states that overall she feels good, but she doesn't have a lot of energy.

## 2015-01-22 ENCOUNTER — Ambulatory Visit: Payer: Medicare Other | Admitting: Internal Medicine

## 2015-01-27 ENCOUNTER — Ambulatory Visit
Admission: RE | Admit: 2015-01-27 | Discharge: 2015-01-27 | Disposition: A | Discharge status: Home / Self Care | Payer: Medicare Other | Source: Ambulatory Visit | Attending: Internal Medicine | Admitting: Internal Medicine

## 2015-01-27 DIAGNOSIS — Z79899 Other long term (current) drug therapy: Secondary | ICD-10-CM | POA: Diagnosis not present

## 2015-01-27 DIAGNOSIS — D3502 Benign neoplasm of left adrenal gland: Secondary | ICD-10-CM | POA: Diagnosis not present

## 2015-01-27 DIAGNOSIS — C189 Malignant neoplasm of colon, unspecified: Secondary | ICD-10-CM | POA: Diagnosis present

## 2015-01-27 DIAGNOSIS — J90 Pleural effusion, not elsewhere classified: Secondary | ICD-10-CM | POA: Insufficient documentation

## 2015-01-27 DIAGNOSIS — C786 Secondary malignant neoplasm of retroperitoneum and peritoneum: Secondary | ICD-10-CM | POA: Diagnosis not present

## 2015-01-27 LAB — GLUCOSE, CAPILLARY: Glucose-Capillary: 91 mg/dL (ref 65–99)

## 2015-01-27 MED ORDER — FLUDEOXYGLUCOSE F - 18 (FDG) INJECTION
12.9700 | Freq: Once | INTRAVENOUS | Status: DC | PRN
  Administered 2015-01-27: 12.97 via INTRAVENOUS
  Filled 2015-01-27: qty 12.97

## 2015-01-29 ENCOUNTER — Inpatient Hospital Stay (HOSPITAL_BASED_OUTPATIENT_CLINIC_OR_DEPARTMENT_OTHER): Payer: Medicare Other | Admitting: Internal Medicine

## 2015-01-29 VITALS — BP 118/73 | HR 54 | Temp 97.6°F | Wt 203.9 lb

## 2015-01-29 DIAGNOSIS — K760 Fatty (change of) liver, not elsewhere classified: Secondary | ICD-10-CM

## 2015-01-29 DIAGNOSIS — E785 Hyperlipidemia, unspecified: Secondary | ICD-10-CM

## 2015-01-29 DIAGNOSIS — M129 Arthropathy, unspecified: Secondary | ICD-10-CM

## 2015-01-29 DIAGNOSIS — C7A8 Other malignant neuroendocrine tumors: Secondary | ICD-10-CM | POA: Diagnosis not present

## 2015-01-29 DIAGNOSIS — E669 Obesity, unspecified: Secondary | ICD-10-CM

## 2015-01-29 DIAGNOSIS — J90 Pleural effusion, not elsewhere classified: Secondary | ICD-10-CM

## 2015-01-29 DIAGNOSIS — N189 Chronic kidney disease, unspecified: Secondary | ICD-10-CM

## 2015-01-29 DIAGNOSIS — Z9049 Acquired absence of other specified parts of digestive tract: Secondary | ICD-10-CM

## 2015-01-29 DIAGNOSIS — D649 Anemia, unspecified: Secondary | ICD-10-CM

## 2015-01-29 DIAGNOSIS — C189 Malignant neoplasm of colon, unspecified: Secondary | ICD-10-CM | POA: Diagnosis not present

## 2015-01-29 DIAGNOSIS — R05 Cough: Secondary | ICD-10-CM

## 2015-01-29 DIAGNOSIS — R918 Other nonspecific abnormal finding of lung field: Secondary | ICD-10-CM

## 2015-01-29 DIAGNOSIS — Z87891 Personal history of nicotine dependence: Secondary | ICD-10-CM

## 2015-01-29 DIAGNOSIS — I129 Hypertensive chronic kidney disease with stage 1 through stage 4 chronic kidney disease, or unspecified chronic kidney disease: Secondary | ICD-10-CM

## 2015-01-29 DIAGNOSIS — C186 Malignant neoplasm of descending colon: Secondary | ICD-10-CM

## 2015-01-29 DIAGNOSIS — K769 Liver disease, unspecified: Secondary | ICD-10-CM

## 2015-01-29 DIAGNOSIS — D3502 Benign neoplasm of left adrenal gland: Secondary | ICD-10-CM

## 2015-01-29 DIAGNOSIS — E119 Type 2 diabetes mellitus without complications: Secondary | ICD-10-CM

## 2015-01-29 DIAGNOSIS — Z800 Family history of malignant neoplasm of digestive organs: Secondary | ICD-10-CM

## 2015-01-29 DIAGNOSIS — M479 Spondylosis, unspecified: Secondary | ICD-10-CM

## 2015-01-29 DIAGNOSIS — F17210 Nicotine dependence, cigarettes, uncomplicated: Secondary | ICD-10-CM

## 2015-01-29 DIAGNOSIS — R160 Hepatomegaly, not elsewhere classified: Secondary | ICD-10-CM

## 2015-01-29 DIAGNOSIS — K219 Gastro-esophageal reflux disease without esophagitis: Secondary | ICD-10-CM

## 2015-02-03 ENCOUNTER — Telehealth: Payer: Self-pay | Admitting: *Deleted

## 2015-02-03 NOTE — Telephone Encounter (Signed)
Called Andrea Vega and let her know about [facility] appt for second opinion for 9/30 with dr. Isaiah Blakes at 1 pm to arrive and see md 1:30. Fax info to [phone] and fed ex cd to [facility] Attn: [facility] [address] [place] [address], [place] [address].  I have fax info except for last note from md and will fed ex cd's as soon as available.  Andrea Vega agreeable to appt date and time. She also was told that [facility] will fedex a packet out with directions and appt date and time to her front door.

## 2015-02-04 NOTE — Progress Notes (Signed)
[place]  Telephone:(336) [phone] Fax:(336) [phone]     ID: [name] OB: [birth year]  MR#: [id number]  SNK#:[id number]  Patient Care Team: Lavonne Chick, MD as PCP - General (Family Medicine)  CHIEF COMPLAINT/DIAGNOSIS:  Stage IIIB (pT4a pN1a cM0) left-sided adenocarcinoma of the colon status post partial colectomy and lymph node dissection on 07/11/2014. 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   (Hospitalized 07/10/2014 with abdominal pain, CT scan of the abdomen and pelvis reported bowel obstruction caused by masslike thickening in the descending colon, stable left adrenal adenoma, diffuse hepatic steatosis, lumbar spondylosis, and degenerative disk disease. The patient then underwent partial colectomy on the left side, appendectomy, and colostomy on March 3 with pathology reporting tumor penetrating the visceral peritoneum with pT4a and pN1a with 1 of 13 regional lymph nodes positive for metastasis. 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   10/14/14 - serum CEA is 14.2.  10/16/14 - PET scan. IMPRESSION: 1. Hypermetabolic loculated fluid adjacent to the spleen may be due to residual abscesses. Peritoneal carcinomatosis is not excluded. 2. Hypermetabolic nodularity, haziness and soft tissue thickening along the left abdomen, as described above, possibly postoperative in nature. Peritoneal carcinomatosis can also have this appearance. 3. Hypermetabolism deep to the rectus abdominus musculature may be postoperative in etiology. 4. Mildly hypermetabolic low right paratracheal lymph node. 5. Moderate left pleural effusion with compressive atelectasis in the left lower lobe. 6. Left adrenal adenoma.  Patient refused adjuvant chemotherapy.   HISTORY OF PRESENT ILLNESS:  Patient returns for oncology followup, recent CEA level had risen to 70. She had PET scan done on 9/19.  States she is  doing fairly steady, eating better. Denies any major abdominal or pelvic pain. Cough has improved. States her bowel movements are doing fairly steady, no blood in stools or urine.   REVIEW OF SYSTEMS:   ROS As in HPI above. In addition, no fevers. No new headaches or focal weakness. No new bone pains. States that she has chronic tingling and numbness in hands and feet from diabetes. PS ECOG 2.  PAST MEDICAL HISTORY: Reviewed. Past Medical History  Diagnosis Date  . Atrial flutter     a. post op and in the setting of sepsis; b. not on long term anticoagulation; c. echo 07/2014: EF 55-60%, technically difficult study  . Obesity   . GERD (gastroesophageal reflux disease)   . HTN (hypertension)   . Colon cancer 10/31/2014  . Diabetes mellitus     diet control  . Elevated lipids   . Chronic kidney disease   . Arthritis     Rt hip  . Open wound     Lt lateral side  Diabetes, hypertension, obesity, tobacco abuse, depression. Cholecystectomy December 2015 Hospitalized 07/10/2014 with abdominal pain, CT scan of the abdomen and pelvis reported bowel obstruction caused by masslike thickening in the descending colon, stable left adrenal adenoma, diffuse hepatic steatosis, lumbar spondylosis, and degenerative disk disease. The patient then underwent partial colectomy on the left side, appendectomy, and colostomy on March 3 with pathology reporting tumor penetrating the visceral peritoneum with pT4a and pN1a with 1 of 13 regional lymph nodes positive for metastasis. 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   PAST SURGICAL HISTORY: Reviewed. Past Surgical History  Procedure Laterality Date  . Colon surgery    . Cholecystectomy    . Wound debridement Left 10/19/2014  Procedure: DEBRIDEMENT ABDOMINAL WOUND;  Surgeon: Molly Maduro, MD;  Location: [facility];  Service: General;  Laterality: Left;  . Peripheral vascular catheterization N/A 11/05/2014     Procedure: Dialysis/Perma Catheter Removal;  Surgeon: Katha Cabal, MD;  Location: [facility];  Service: Cardiovascular;  Laterality: N/A;  . Bascilic vein transposition Left 11/15/2014    Procedure: BASCILIC VEIN TRANSPOSITION;  Surgeon: Katha Cabal, MD;  Location: [facility];  Service: Vascular;  Laterality: Left;    FAMILY HISTORY: Reviewed. Family History  Problem Relation Age of Onset  . Colon cancer Mother   . Diabetes Mother   . Hypertension Mother   . Lung cancer Father   . Lung cancer Paternal Grandfather     ADVANCED DIRECTIVES:  No - patient declined information  SOCIAL HISTORY: Reviewed. Social History  Substance Use Topics  . Smoking status: Former Smoker    Quit date: 07/10/2014  . Smokeless tobacco: Never Used  . Alcohol Use: No    Allergies  Allergen Reactions  . Lorazepam Shortness Of Breath    Reaction:  severe   . Morphine And Related Nausea And Vomiting    Reaction: unknown  . Penicillins Hives  . Zosyn [Piperacillin Sod-Tazobactam So] Other (See Comments)    Reaction: unknown  . Clindamycin/Lincomycin Rash    Current Outpatient Prescriptions  Medication Sig Dispense Refill  . amiodarone (PACERONE) 100 MG tablet Take 100 mg by mouth daily.    Marland Kitchen b complex-vitamin c-folic acid (NEPHRO-VITE) 0.8 MG TABS tablet Take 1 tablet by mouth at bedtime.    Marland Kitchen levothyroxine (SYNTHROID, LEVOTHROID) 100 MCG tablet Take by mouth daily before breakfast.    . losartan (COZAAR) 100 MG tablet Take 100 mg by mouth at bedtime.     . OxyCODONE HCl, Abuse Deter, (OXAYDO) 5 MG TABA Take 5 mg by mouth every 8 (eight) hours.    . sertraline (ZOLOFT) 100 MG tablet Take 100 mg by mouth at bedtime.     Marland Kitchen warfarin (COUMADIN) 1 MG tablet Take 1 mg by mouth daily. 4 tabs on Mon, Wed, Fri and 3 tabs on Sat, Sun, Tues, Thurs.    . warfarin (COUMADIN) 5 MG tablet Take 5.5 mg by mouth at bedtime.     Marland Kitchen warfarin (COUMADIN) 5 MG tablet Take 5 mg by mouth daily.     No  current facility-administered medications for this visit.    PHYSICAL EXAM: Filed Vitals:   01/29/15 1527  BP: 118/73  Pulse: 54  Temp: 97.6 F (36.4 C)     Body mass index is 32.31 kg/(m^2).    ECOG FS:2 - Symptomatic, <50% confined to bed  GENERAL: Alert and oriented and in no acute distress. No icterus. LUNGS: Bilaterally diminished breath sounds overall especially at bases, no rhonchi. ABDOMEN: Soft, nontender.   EXTREMITIES: Trace pedal edema  LAB RESULTS: Today - Creatinine 2.69, LFT unremarkable, WBC 9.3, hemoglobin 11.1, platelets 225. 01/14/15 - serum CEA 48.2 (was 14.2 on 10/14/14).  STUDIES: 10/03/14 -  CT scan of the abdomen/pelvis. IMPRESSION: 1. Left upper quadrant drain in place with near complete resolution of perisplenic fluid collection. 2. Otherwise, degraded exam secondary to multiple factors detailed above. 3. Decrease in left pleural effusion with adjacent left lower lobe atelectasis or infection. 4. Similar size a left adrenal nodule. 5. Possible constipation. 6. Hepatomegaly and trace ascites.  10/16/14 - PET scan. IMPRESSION: 1. Hypermetabolic loculated fluid adjacent to the spleen may be due to residual abscesses. Peritoneal carcinomatosis is  not excluded. 2. Hypermetabolic nodularity, haziness and soft tissue thickening along the left abdomen, as described above, possibly postoperative in nature. Peritoneal carcinomatosis can also have this appearance. 3. Hypermetabolism deep to the rectus abdominus musculature may be postoperative in etiology. 4. Mildly hypermetabolic low right paratracheal lymph node. 5. Moderate left pleural effusion with compressive atelectasis in the left lower lobe. 6. Left adrenal adenoma.  01/20/15 - CT C/A/P without contrast.   IMPRESSION: Chest Impression:  1. A 4 mm RIGHT upper lobe pulmonary nodules is not seen on comparison exams and is concerning for metastatic nodule.  2. Small LEFT lower lobe nodule is not seen but there was a large  effusion on comparison exams.  3. Interval decrease in LEFT pleural effusion. Abdomen / Pelvis Impression:  1. Persistent nodularity along the pararenal fascia anterior to the LEFT kidney as well as LEFT upper quadrant peritoneal nodularity. These findings are complicated by previous splenic abscesses in this same location however the persistent nodularity is concerning for metastatic peritoneal disease.  2. Peristomal hernia contains large and small bowel without evidence of bowel obstruction.  01/27/15 - PET scan. IMPRESSION:  1. Widespread intraperitoneal metastatic disease, the extent of which is very similar to recent CT of the chest, abdomen and pelvis 01/20/2015. When compared to prior PET-CT 10/16/2014, the overall bulk of intraperitoneal metastases is largely similar to the prior study, with exception of some enlarging implants in the upper abdomen adjacent to the lesser curvature of the stomach, as discussed above.  2. In addition, there are new areas of hypermetabolism in the liver, concerning for intra hepatic metastases. There is also a focus of hypermetabolism in the anterior aspect of the spleen which may represent an additional metastatic lesion (this appears separate from the previously described splenic abscess).  3. No suspicious pulmonary nodules or masses.  4. Small left pleural effusion is unchanged compared to CT 01/20/2015, but decreased compared to prior PET-CT.  5. 2.4 cm left adrenal adenoma is unchanged.  6. Extensive hypermetabolism in the tongue and in the left lateral pterygoid muscle. No definite soft tissue mass in these areas. These findings are presumably physiologic, but correlation with physical examination and history is recommended.  7. Additional incidental findings, as above    ASSESSMENT / PLAN:   1. Stage IIIB (pT4a pN1a cM0) left-sided adenocarcinoma of the colon status post partial colectomy and lymph node dissection on 07/11/2014. 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  (Hospitalized 07/10/14 with abdominal pain, CT scan of the abdomen and pelvis reported bowel obstruction caused by masslike thickening in the descending colon, stable left adrenal adenoma, diffuse hepatic steatosis, lumbar spondylosis, and degenerative disk disease. The patient then underwent partial colectomy on the left side, appendectomy, and colostomy on March 3 with pathology reporting tumor penetrating the visceral peritoneum with pT4a and pN1a with 1 of 13 regional lymph nodes positive for metastasis. 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 Also explained possible side effects of chemotherapy in general. Patient and family want second opinion from [facility]  hospital, will make referral to [place] as per their request. Will f/u on 10/4 and make further plan of management.   2. Anemia - likely secondary to renal insufficiency and recent surgeries. Hb is improving. Patient does not do much physical activity and does not want to consider ESA therapy like Procrit at this time.       3. In between visits, the patient has been advised to call or come to the ER in case of fevers, bleeding, acute sickness or new symptoms. Patient is agreeable to this plan.      Leia Alf, MD   02/04/2015 6:16 AM

## 2015-02-11 ENCOUNTER — Ambulatory Visit: Payer: Medicare Other | Admitting: Internal Medicine

## 2015-02-12 ENCOUNTER — Ambulatory Visit: Payer: Medicare Other | Admitting: Surgery

## 2015-03-06 ENCOUNTER — Telehealth: Payer: Self-pay | Admitting: *Deleted

## 2015-03-06 NOTE — Telephone Encounter (Signed)
I asked staff to call pt and see if she went to [facility] and if we can make f/u appt with her and the response from the patient was :    Patient does not want this appt. She will call back when she would like to r/s.    ----- Message -----     From: Luella Cook, RN     Sent: 02/09/2015  8:26 PM      To: Scheduling Message Pool        Dr. Ma Hillock wanted to see pt this week poss. tues if that is ok with pt. she went for 2nd opinion on 9/30. he wants just md visit so we can see if we need to make a plan for her.

## 2015-03-18 LAB — SURGICAL PATHOLOGY

## 2015-07-01 ENCOUNTER — Inpatient Hospital Stay: Payer: Medicare Other

## 2015-07-01 ENCOUNTER — Ambulatory Visit: Payer: Medicare Other | Admitting: Internal Medicine

## 2015-07-11 LAB — SURGICAL PATHOLOGY

## 2016-01-08 ENCOUNTER — Encounter: Payer: Self-pay | Admitting: *Deleted

## 2016-01-22 ENCOUNTER — Encounter
Admission: RE | Disposition: A | Discharge status: Home / Self Care | Payer: Self-pay | Source: Ambulatory Visit | Attending: Ophthalmology

## 2016-01-22 ENCOUNTER — Ambulatory Visit: Payer: Medicare Other | Admitting: Anesthesiology

## 2016-01-22 ENCOUNTER — Ambulatory Visit
Admission: RE | Admit: 2016-01-22 | Discharge: 2016-01-22 | Disposition: A | Discharge status: Home / Self Care | Payer: Medicare Other | Source: Ambulatory Visit | Attending: Ophthalmology | Admitting: Ophthalmology

## 2016-01-22 ENCOUNTER — Encounter: Payer: Self-pay | Admitting: Anesthesiology

## 2016-01-22 DIAGNOSIS — Z87891 Personal history of nicotine dependence: Secondary | ICD-10-CM | POA: Diagnosis not present

## 2016-01-22 DIAGNOSIS — G4730 Sleep apnea, unspecified: Secondary | ICD-10-CM | POA: Diagnosis not present

## 2016-01-22 DIAGNOSIS — E079 Disorder of thyroid, unspecified: Secondary | ICD-10-CM | POA: Diagnosis not present

## 2016-01-22 DIAGNOSIS — C801 Malignant (primary) neoplasm, unspecified: Secondary | ICD-10-CM | POA: Diagnosis not present

## 2016-01-22 DIAGNOSIS — F329 Major depressive disorder, single episode, unspecified: Secondary | ICD-10-CM | POA: Diagnosis not present

## 2016-01-22 DIAGNOSIS — Z885 Allergy status to narcotic agent status: Secondary | ICD-10-CM | POA: Diagnosis not present

## 2016-01-22 DIAGNOSIS — Z9049 Acquired absence of other specified parts of digestive tract: Secondary | ICD-10-CM | POA: Insufficient documentation

## 2016-01-22 DIAGNOSIS — Z933 Colostomy status: Secondary | ICD-10-CM | POA: Diagnosis not present

## 2016-01-22 DIAGNOSIS — I10 Essential (primary) hypertension: Secondary | ICD-10-CM | POA: Insufficient documentation

## 2016-01-22 DIAGNOSIS — K219 Gastro-esophageal reflux disease without esophagitis: Secondary | ICD-10-CM | POA: Insufficient documentation

## 2016-01-22 DIAGNOSIS — J449 Chronic obstructive pulmonary disease, unspecified: Secondary | ICD-10-CM | POA: Insufficient documentation

## 2016-01-22 DIAGNOSIS — R062 Wheezing: Secondary | ICD-10-CM | POA: Insufficient documentation

## 2016-01-22 DIAGNOSIS — E1136 Type 2 diabetes mellitus with diabetic cataract: Secondary | ICD-10-CM | POA: Insufficient documentation

## 2016-01-22 DIAGNOSIS — Z881 Allergy status to other antibiotic agents status: Secondary | ICD-10-CM | POA: Diagnosis not present

## 2016-01-22 DIAGNOSIS — F419 Anxiety disorder, unspecified: Secondary | ICD-10-CM | POA: Insufficient documentation

## 2016-01-22 DIAGNOSIS — Z888 Allergy status to other drugs, medicaments and biological substances status: Secondary | ICD-10-CM | POA: Diagnosis not present

## 2016-01-22 DIAGNOSIS — M1990 Unspecified osteoarthritis, unspecified site: Secondary | ICD-10-CM | POA: Insufficient documentation

## 2016-01-22 DIAGNOSIS — K5790 Diverticulosis of intestine, part unspecified, without perforation or abscess without bleeding: Secondary | ICD-10-CM | POA: Insufficient documentation

## 2016-01-22 DIAGNOSIS — H2512 Age-related nuclear cataract, left eye: Secondary | ICD-10-CM | POA: Diagnosis present

## 2016-01-22 HISTORY — DX: Major depressive disorder, single episode, unspecified: F32.9

## 2016-01-22 HISTORY — PX: CATARACT EXTRACTION W/PHACO: SHX586

## 2016-01-22 HISTORY — DX: Chronic obstructive pulmonary disease, unspecified: J44.9

## 2016-01-22 HISTORY — DX: Depression, unspecified: F32.A

## 2016-01-22 HISTORY — DX: Sleep apnea, unspecified: G47.30

## 2016-01-22 HISTORY — DX: Anxiety disorder, unspecified: F41.9

## 2016-01-22 HISTORY — DX: Wheezing: R06.2

## 2016-01-22 LAB — GLUCOSE, CAPILLARY: GLUCOSE-CAPILLARY: 97 mg/dL (ref 65–99)

## 2016-01-22 SURGERY — PHACOEMULSIFICATION, CATARACT, WITH IOL INSERTION
Anesthesia: Monitor Anesthesia Care | Site: Eye | Laterality: Right | Wound class: Clean

## 2016-01-22 MED ORDER — SODIUM CHLORIDE 0.9 % IV SOLN
INTRAVENOUS | Status: DC
  Administered 2016-01-22 (×2): via INTRAVENOUS

## 2016-01-22 MED ORDER — ARMC OPHTHALMIC DILATING GEL
OPHTHALMIC | Status: AC
  Administered 2016-01-22: 1 via OPHTHALMIC
  Filled 2016-01-22: qty 0.25

## 2016-01-22 MED ORDER — ARMC OPHTHALMIC DILATING GEL
1.0000 "application " | OPHTHALMIC | Status: AC | PRN
  Administered 2016-01-22 (×2): 1 via OPHTHALMIC

## 2016-01-22 MED ORDER — SODIUM HYALURONATE 10 MG/ML IO SOLN
INTRAOCULAR | Status: DC | PRN
  Administered 2016-01-22: 0.85 mL via INTRAOCULAR

## 2016-01-22 MED ORDER — MIDAZOLAM HCL 2 MG/2ML IJ SOLN
INTRAMUSCULAR | Status: DC | PRN
  Administered 2016-01-22: 1 mg via INTRAVENOUS

## 2016-01-22 MED ORDER — EPINEPHRINE HCL 1 MG/ML IJ SOLN
INTRAMUSCULAR | Status: AC
  Filled 2016-01-22: qty 2

## 2016-01-22 MED ORDER — SODIUM HYALURONATE 10 MG/ML IO SOLN
INTRAOCULAR | Status: AC
  Filled 2016-01-22: qty 0.85

## 2016-01-22 MED ORDER — LIDOCAINE HCL (PF) 4 % IJ SOLN
INTRAMUSCULAR | Status: AC
  Filled 2016-01-22: qty 5

## 2016-01-22 MED ORDER — MOXIFLOXACIN HCL 0.5 % OP SOLN
OPHTHALMIC | Status: AC
  Filled 2016-01-22: qty 3

## 2016-01-22 MED ORDER — MOXIFLOXACIN HCL 0.5 % OP SOLN: 1.0000 [drp] | OPHTHALMIC | Status: DC | PRN

## 2016-01-22 MED ORDER — EPINEPHRINE HCL 1 MG/ML IJ SOLN
INTRAMUSCULAR | Status: DC | PRN
  Administered 2016-01-22: 12:00:00 via OPHTHALMIC

## 2016-01-22 MED ORDER — LIDOCAINE HCL (PF) 4 % IJ SOLN
INTRAOCULAR | Status: DC | PRN
  Administered 2016-01-22: .5 mL via OPHTHALMIC

## 2016-01-22 MED ORDER — SODIUM HYALURONATE 23 MG/ML IO SOLN
INTRAOCULAR | Status: DC | PRN
  Administered 2016-01-22: 0.6 mL via INTRAOCULAR

## 2016-01-22 MED ORDER — SODIUM HYALURONATE 23 MG/ML IO SOLN
INTRAOCULAR | Status: AC
  Filled 2016-01-22: qty 0.6

## 2016-01-22 MED ORDER — TRYPAN BLUE 0.06 % OP SOLN
OPHTHALMIC | Status: DC | PRN
  Administered 2016-01-22: 0.5 mL via INTRAOCULAR

## 2016-01-22 MED ORDER — POVIDONE-IODINE 5 % OP SOLN
1.0000 "application " | Freq: Once | OPHTHALMIC | Status: AC
  Administered 2016-01-22: 1 via OPHTHALMIC

## 2016-01-22 MED ORDER — MOXIFLOXACIN HCL 0.5 % OP SOLN
OPHTHALMIC | Status: DC | PRN
  Administered 2016-01-22: 2 [drp] via OPHTHALMIC

## 2016-01-22 MED ORDER — TETRACAINE HCL 0.5 % OP SOLN
OPHTHALMIC | Status: AC
  Administered 2016-01-22: 1 [drp] via OPHTHALMIC
  Filled 2016-01-22: qty 2

## 2016-01-22 MED ORDER — POVIDONE-IODINE 5 % OP SOLN
OPHTHALMIC | Status: AC
  Administered 2016-01-22: 1 via OPHTHALMIC
  Filled 2016-01-22: qty 30

## 2016-01-22 MED ORDER — TRYPAN BLUE 0.06 % OP SOLN
OPHTHALMIC | Status: AC
  Filled 2016-01-22: qty 0.5

## 2016-01-22 MED ORDER — FENTANYL CITRATE (PF) 100 MCG/2ML IJ SOLN
INTRAMUSCULAR | Status: DC | PRN
  Administered 2016-01-22: 50 ug via INTRAVENOUS

## 2016-01-22 MED ORDER — TETRACAINE HCL 0.5 % OP SOLN
1.0000 [drp] | Freq: Once | OPHTHALMIC | Status: AC
  Administered 2016-01-22: 1 [drp] via OPHTHALMIC

## 2016-01-22 SURGICAL SUPPLY — 28 items
CANNULA ANT/CHMB 27GA (MISCELLANEOUS) ×6 IMPLANT
CUP MEDICINE 2OZ PLAST GRAD ST (MISCELLANEOUS) ×2 IMPLANT
DRAPE XRAY CASSETTE 23X24 (DRAPES) ×2 IMPLANT
FILTER MILLEX .045 (MISCELLANEOUS) ×1 IMPLANT
FLTR MILLEX .045 (MISCELLANEOUS) ×2
GLOVE BIO SURGEON STRL SZ8 (GLOVE) ×2 IMPLANT
GLOVE BIOGEL M 6.5 STRL (GLOVE) ×2 IMPLANT
GLOVE SURG LX 7.5 STRW (GLOVE) ×1
GLOVE SURG LX STRL 7.5 STRW (GLOVE) ×1 IMPLANT
GOWN STRL REUS W/ TWL LRG LVL3 (GOWN DISPOSABLE) ×2 IMPLANT
GOWN STRL REUS W/TWL LRG LVL3 (GOWN DISPOSABLE) ×2
LENS IOL ACRSF IQ PC 19.5 (Intraocular Lens) ×1 IMPLANT
LENS IOL ACRYSOF IQ POST 19.5 (Intraocular Lens) ×2 IMPLANT
PACK CATARACT (MISCELLANEOUS) ×2 IMPLANT
PACK CATARACT BRASINGTON LX (MISCELLANEOUS) ×2 IMPLANT
PACK EYE AFTER SURG (MISCELLANEOUS) ×2 IMPLANT
SOL BAL SALT 15ML (MISCELLANEOUS) ×2
SOL BSS BAG (MISCELLANEOUS) ×2
SOL PREP PVP 2OZ (MISCELLANEOUS) ×2
SOLUTION BAL SALT 15ML (MISCELLANEOUS) ×1 IMPLANT
SOLUTION BSS BAG (MISCELLANEOUS) ×1 IMPLANT
SOLUTION PREP PVP 2OZ (MISCELLANEOUS) ×1 IMPLANT
SUT ETHILON 10 0 CS140 6 (SUTURE) ×2 IMPLANT
SYR 3ML LL SCALE MARK (SYRINGE) ×8 IMPLANT
SYR 5ML LL (SYRINGE) ×2 IMPLANT
SYR TB 1ML 27GX1/2 LL (SYRINGE) ×2 IMPLANT
WATER STERILE IRR 250ML POUR (IV SOLUTION) ×2 IMPLANT
WIPE NON LINTING 3.25X3.25 (MISCELLANEOUS) ×2 IMPLANT

## 2016-01-22 NOTE — Op Note (Signed)
OPERATIVE NOTE  Andrea Vega [id number] 01/22/2016   PREOPERATIVE DIAGNOSIS:  Nuclear sclerotic cataract left eye.  H25.12   POSTOPERATIVE DIAGNOSIS:    Nuclear sclerotic cataract left eye.     PROCEDURE:  Complex Phacoemusification with posterior chamber intraocular lens placement of the left eye, requiring trypan blue for visualization of the anterior capsule.   LENS:   Implant Name Type Inv. Item Serial No. Manufacturer Lot No. LRB No. Used  IMPLANT LENS - [id number] Intraocular Lens IMPLANT LENS [id number] ALCON   Right 1       [id number] 19.5   ULTRASOUND TIME: 2 minutes 50 seconds.  CDE 28.72   SURGEON:  Benay Pillow, MD, MPH   ANESTHESIA:  Topical with tetracaine drops and 2% Xylocaine jelly, augmented with 1% preservative-free intracameral lidocaine.   COMPLICATIONS:  None.   DESCRIPTION OF PROCEDURE:  The patient was identified in the holding room and transported to the operating room and placed in the supine position under the operating microscope.  The left eye was identified as the operative eye and it was prepped and draped in the usual sterile ophthalmic fashion.   A 1.0 millimeter clear-corneal paracentesis was made at the 5:00 position. 0.5 ml of preservative-free 1% lidocaine with epinephrine was injected into the anterior chamber. There was a poor red reflex, so Trypan blue was instilled under an air bubble and rinsed out with BSS to stain the capsule for improved visualization.   The anterior chamber was filled with Healon 5 viscoelastic.  A 2.4 millimeter keratome was used to make a near-clear corneal incision at the 2:00 position.  A curvilinear capsulorrhexis was made with a cystotome and capsulorrhexis forceps.  Balanced salt solution was used to hydrodissect and hydrodelineate the nucleus.   Phacoemulsification was then used in stop and chop fashion to remove the lens nucleus and epinucleus.  The remaining cortex was then removed using the irrigation and  aspiration handpiece. Healon was then placed into the capsular bag to distend it for lens placement.  A lens was then injected into the capsular bag.  The remaining viscoelastic was aspirated.   Wounds were hydrated with balanced salt solution.  The anterior chamber was inflated to a physiologic pressure with balanced salt solution.   Intracameral vigamox 0.1 mL undiltued was injected into the eye.  No wound leaks were noted.  Topical Vigamox drops were applied to the eye.  The patient was taken to the recovery room in stable condition without complications of anesthesia or surgery  Benay Pillow 01/22/2016, 12:17 PM

## 2016-01-22 NOTE — Anesthesia Postprocedure Evaluation (Signed)
Anesthesia Post Note  Patient: Andrea Vega  Procedure(s) Performed: Procedure(s) (LRB): CATARACT EXTRACTION PHACO AND INTRAOCULAR LENS PLACEMENT (IOC) (Right)  Patient location during evaluation: Short Stay Anesthesia Type: MAC Level of consciousness: awake and alert and oriented Pain management: pain level controlled Vital Signs Assessment: post-procedure vital signs reviewed and stable Respiratory status: spontaneous breathing Cardiovascular status: stable Postop Assessment: no headache Anesthetic complications: no    Last Vitals:  Vitals:   01/22/16 1218 01/22/16 1226  BP: (!) 102/53 (!) 115/43  Pulse: 64 62  Resp: 16   Temp: 36.1 C     Last Pain:  Vitals:   01/22/16 1218  TempSrc: Tympanic  PainSc:                  Lanora Manis

## 2016-01-22 NOTE — Anesthesia Preprocedure Evaluation (Signed)
Anesthesia Evaluation  Patient identified by MRN, date of birth, ID band Patient awake    Reviewed: Allergy & Precautions, NPO status , Patient's Chart, lab work & pertinent test results, reviewed documented beta blocker date and time   Airway Mallampati: II  TM Distance: >3 FB     Dental  (+) Chipped   Pulmonary sleep apnea , COPD, former smoker,           Cardiovascular hypertension, Pt. on medications + dysrhythmias Atrial Fibrillation      Neuro/Psych PSYCHIATRIC DISORDERS Anxiety Depression    GI/Hepatic GERD  Controlled,  Endo/Other  diabetes, Type 2  Renal/GU Renal disease     Musculoskeletal  (+) Arthritis ,   Abdominal   Peds  Hematology   Anesthesia Other Findings Colon ca. Echo OK.  Reproductive/Obstetrics                             Anesthesia Physical Anesthesia Plan  ASA: III  Anesthesia Plan: MAC   Post-op Pain Management:    Induction:   Airway Management Planned:   Additional Equipment:   Intra-op Plan:   Post-operative Plan:   Informed Consent: I have reviewed the patients History and Physical, chart, labs and discussed the procedure including the risks, benefits and alternatives for the proposed anesthesia with the patient or authorized representative who has indicated his/her understanding and acceptance.     Plan Discussed with: CRNA  Anesthesia Plan Comments:         Anesthesia Quick Evaluation

## 2016-01-22 NOTE — H&P (Signed)
The History and Physical notes are on paper, have been signed, and are to be scanned. The patient remains stable and unchanged from the H&P.   Previous H&P reviewed, patient examined, and there are no changes.  Andrea Vega 01/22/2016 11:33 AM

## 2016-01-22 NOTE — Transfer of Care (Signed)
Immediate Anesthesia Transfer of Care Note  Patient: Graci A Kleinfelter  Procedure(s) Performed: Procedure(s) with comments: CATARACT EXTRACTION PHACO AND INTRAOCULAR LENS PLACEMENT (IOC) (Right) - [place]    2:49.5 AP%  16.9 CDE   28.72 Fluid Pack lot # [id number]  Patient Location: PACU and Short Stay  Anesthesia Type:MAC  Level of Consciousness: awake, alert  and oriented  Airway & Oxygen Therapy: Patient Spontanous Breathing  Post-op Assessment: Report given to RN and Post -op Vital signs reviewed and stable  Post vital signs: Reviewed and stable  Last Vitals:  Vitals:   01/22/16 1042 01/22/16 1218  BP: (!) 146/53 (!) 102/53  Pulse: 77 64  Resp: 18 16  Temp: 36.8 C 36.1 C    Last Pain:  Vitals:   01/22/16 1218  TempSrc: Tympanic  PainSc:          Complications: No apparent anesthesia complications

## 2016-04-05 IMAGING — CR DG CHEST 1V PORT
1 series · 1 of 1 positions shown · non-contrast
Comparison: Portable chest x-ray July 24, 2014

CLINICAL DATA: Respiratory failure

EXAM:
PORTABLE CHEST - 1 VIEW

[AP]
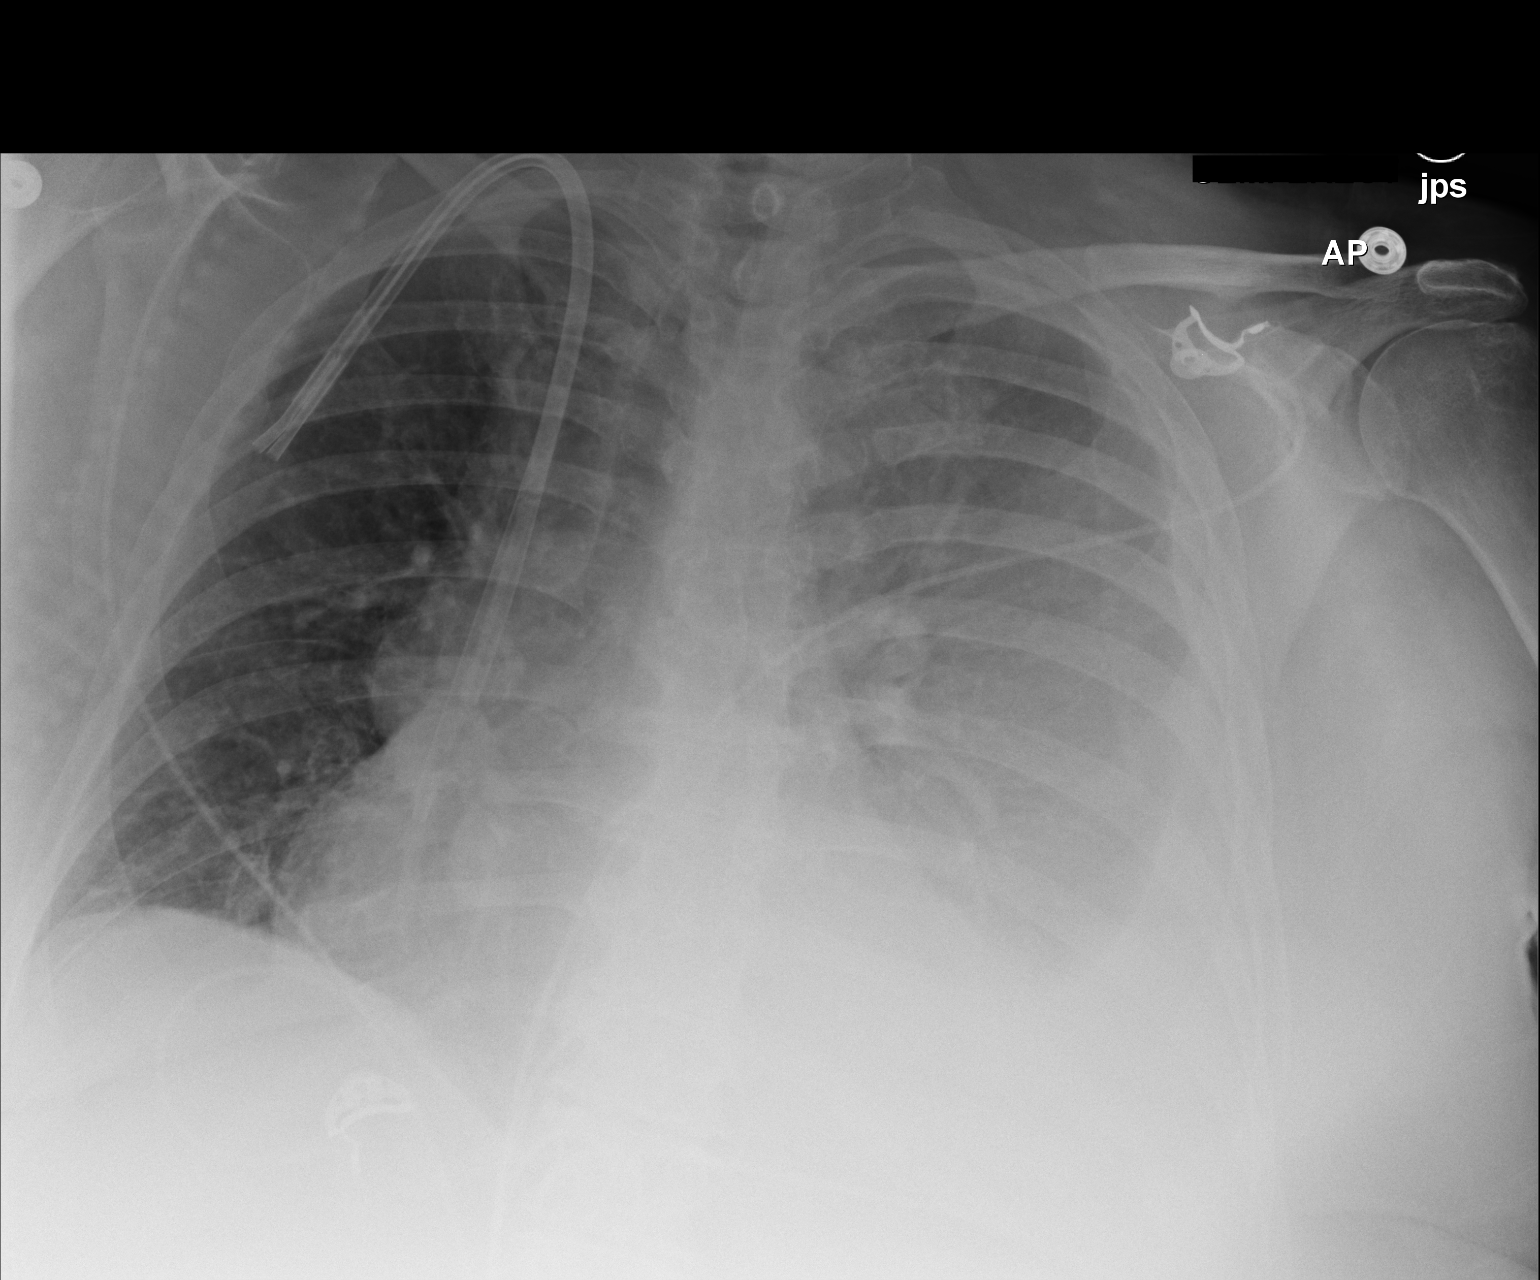

[1 of 1 positions shown; findings below may reference images not displayed]

FINDINGS: The right lung is adequately inflated and clear. On the left there
is persistent moderate-sized pleural effusion layering posteriorly.
The cardiac silhouette remains enlarged. There is mild stable
mediastinal shift toward the right. The dialysis type catheter is
unchanged in position.
IMPRESSION: Stable appearance of the chest since [REDACTED]. A moderate size
left pleural effusion persists. There is a stable mild mediastinal
shift to the right.

## 2016-04-07 IMAGING — CT CT ABD-PELV W/O CM
2 of 4 series · 15 of 46 positions shown, 17 images · non-contrast
Comparison: 07/31/2014

CLINICAL DATA: Hospitalized for 5 weeks.  [REDACTED] patient.

EXAM:
CT ABDOMEN AND PELVIS WITHOUT CONTRAST
TECHNIQUE: Multidetector CT imaging of the abdomen and pelvis was performed
following the standard protocol without IV contrast.

[Series 2: abd/ pelvis 5.0 i30f 1 · axial · 0.98mm/px · z∈[-524,-108]mm · 12 of 99 slices shown, 14 images]
[im 8/99  soft-tissue]
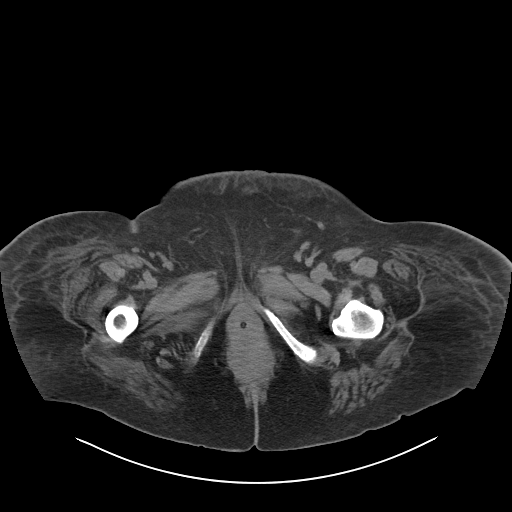
[im 8/99  bone]
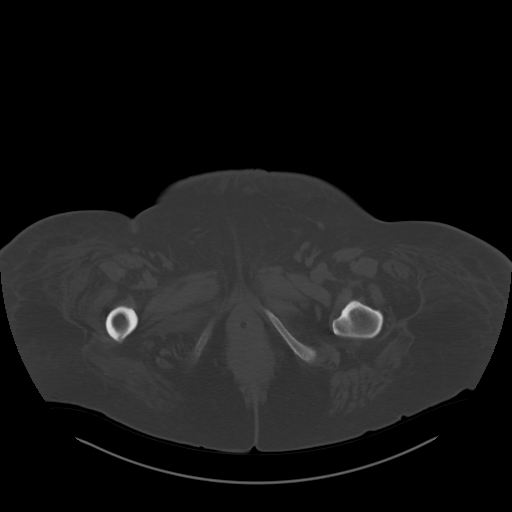
[im 16/99  soft-tissue]
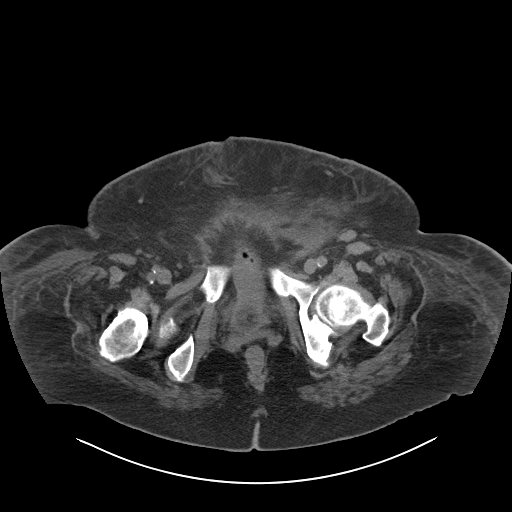
[im 23/99  soft-tissue]
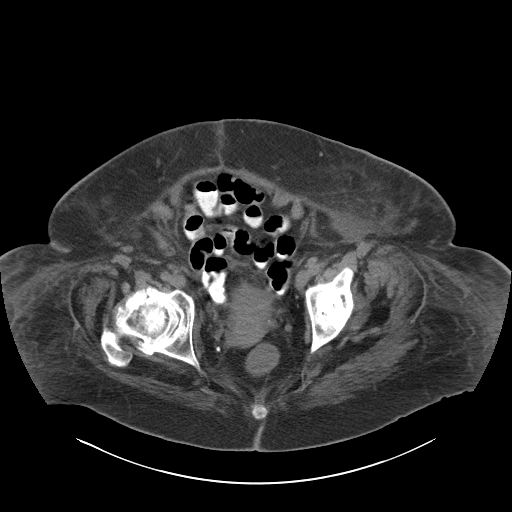
[im 31/99  soft-tissue]
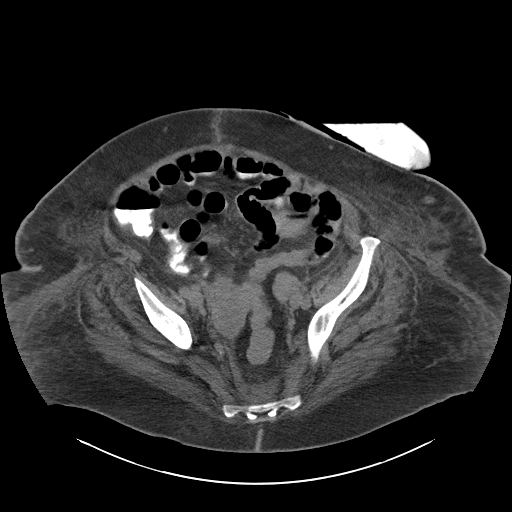
[im 38/99  soft-tissue]
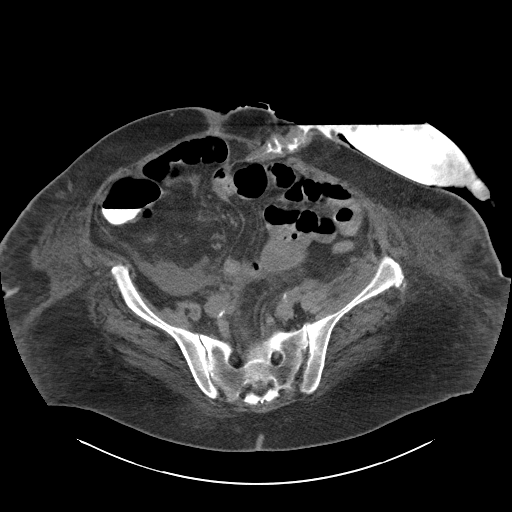
[im 46/99  soft-tissue]
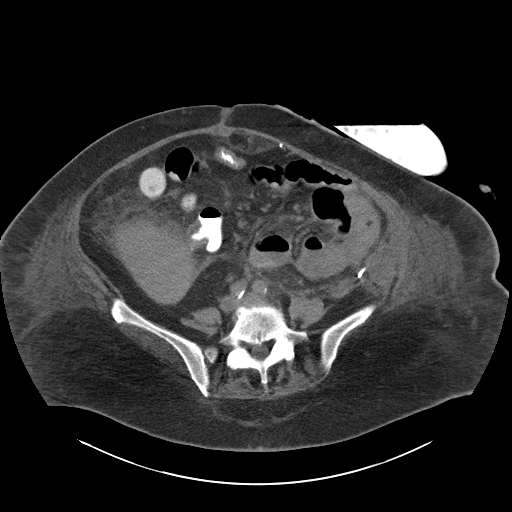
[im 53/99  soft-tissue]
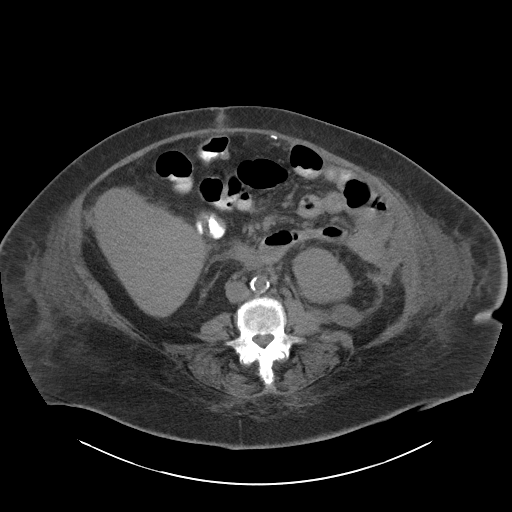
[im 61/99  soft-tissue]
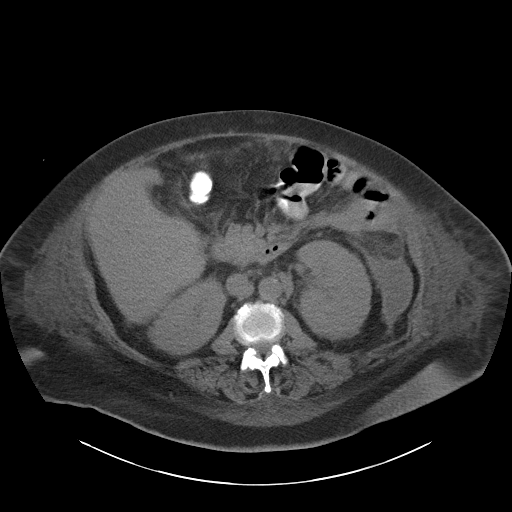
[im 68/99  soft-tissue]
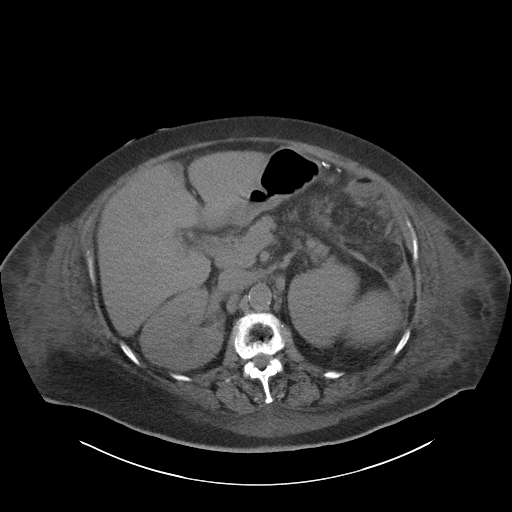
[im 68/99  bone]
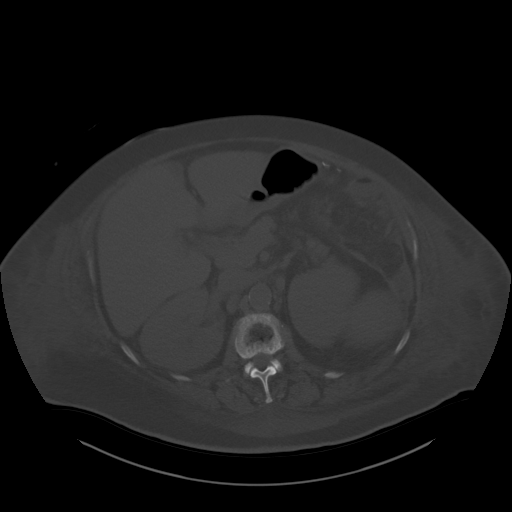
[im 76/99  soft-tissue]
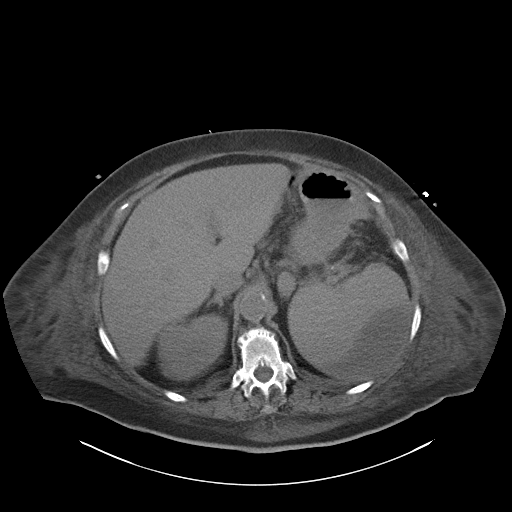
[im 83/99  soft-tissue]
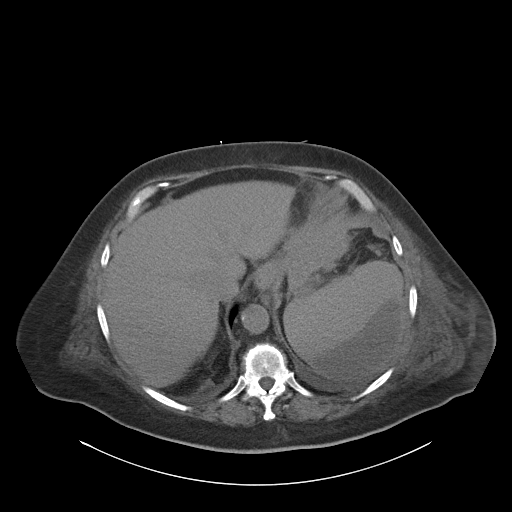
[im 91/99  soft-tissue]
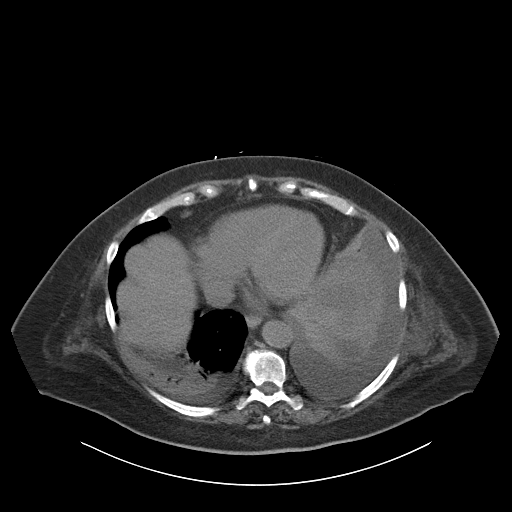

[Series 5: coronals · coronal · 0.99mm/px · 3 of 174 slices shown]
[im 58/174  soft-tissue]
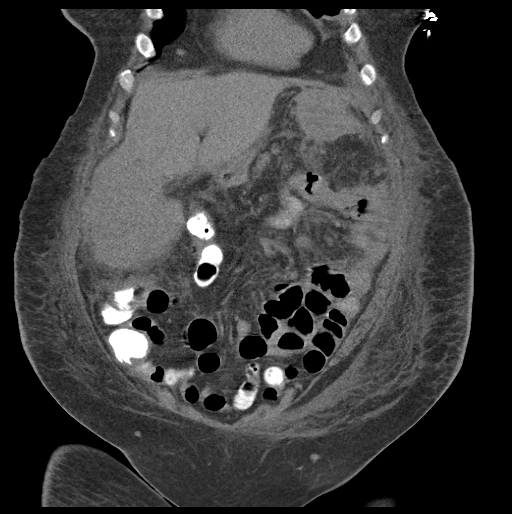
[im 77/174  soft-tissue]
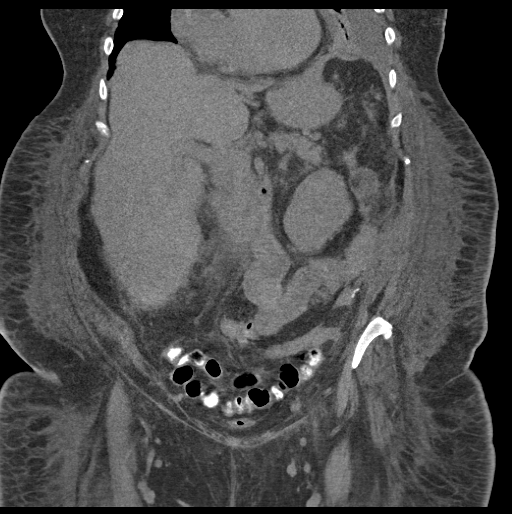
[im 97/174  soft-tissue]
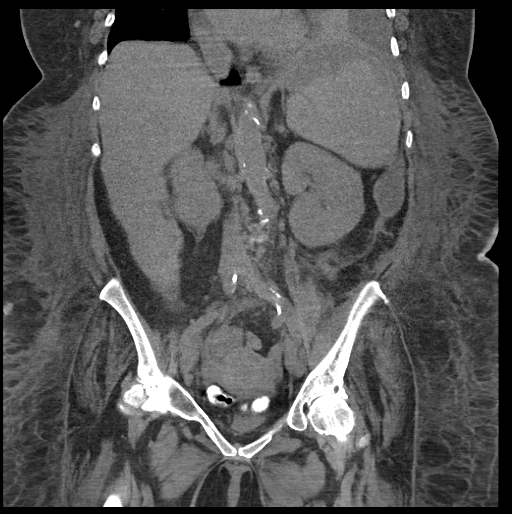

[15 of 46 positions shown; findings below may reference images not displayed]

FINDINGS: Lower chest: There is a moderate, partially loculated left pleural
effusion which appears similar in volume to the previous exam. Small
simple appearing or right pleural effusion noted. Airspace
consolidation overlying the pleural effusions noted bilaterally.

Hepatobiliary: No focal liver abnormality. Previous cholecystectomy.
No biliary dilatation.

Pancreas: Unremarkable appearance of the pancreas.

Spleen: Perisplenic fluid collection is again identified. The medial
component measures 7.8 x 3.4 cm, image 12/series 2. This is
unchanged from previous exam. The posterior lateral component
measures 10.4 x 4.3 cm, image 21/series 2. Previously 10.8 x 3.7 cm.

Adrenals/Urinary Tract: Normal appearance of the right adrenal
gland. Stable left adrenal nodule measuring 1.6 x 2.6 cm. Normal
appearance of the left kidney. Low-attenuation structure within the
inferior pole of the right kidney is unchanged measuring 1.9 cm,
image 35/series 2. The urinary bladder is collapsed around a Foley
catheter.

Stomach/Bowel: Normal appearance of the stomach. The small bowel
loops have a normal caliber without evidence for obstruction. There
is a left lower quadrant colostomy. No evidence for bowel
obstruction. The ingested enteric contrast material is noted within
the distal colon as well as the colostomy bag.

Vascular/Lymphatic: Calcified atherosclerotic disease involves the
abdominal aorta. No aneurysm. Prominent retroperitoneal lymph nodes
are day and identified. Index periaortic lymph node measures 1.2 cm,
image 37/series 2. This is similar to previous exam.

Reproductive: Normal appearance of the uterus. Enlarged right ovary.
This is of uncertain clinical significance and difficult to evaluate
secondary to lack of IV contrast material, image 68/series 2

Other: A small amount of free fluid is identified within the
dependent portion of the pelvis and along the inferior margin of the
right lobe of liver. Areas of loculated fluid within the left hemi
abdomen are again noted. Previously referenced loculated fluid
adjacent to the left kidney measures 3.5 x 6.6 x 9.2 cm, image
37/series 2. Previously this measured 3.8 x 6.8 x 8.4 cm.

Musculoskeletal: Multi level degenerative disc disease noted within
the lower thoracic and throughout the lumbar spine. There is
advanced degenerative changes involving the right hip.
IMPRESSION: 1. Fluid collections surround the spleen and left side of abdomen
are not significantly changed in volume compared with previous exam.
2. Loculated left pleural effusion is not significantly changed in
volume from previous study.
3. Left lower quadrant colostomy is patent without evidence for
bowel obstruction.
4. Asymmetric enlargement of the right ovary of on certain clinical
significance.
5. Stable borderline enlarged retroperitoneal lymph nodes.
6. Atherosclerotic disease.

## 2016-04-08 IMAGING — CR DG CHEST 1V
1 series · 1 of 1 positions shown · non-contrast
Comparison: Plain film of the chest 08/12/2014. CT abdomen and
pelvis 08/14/2014.

CLINICAL DATA: Status post left thoracentesis.

EXAM:
CHEST  1 VIEW

[chest ap]
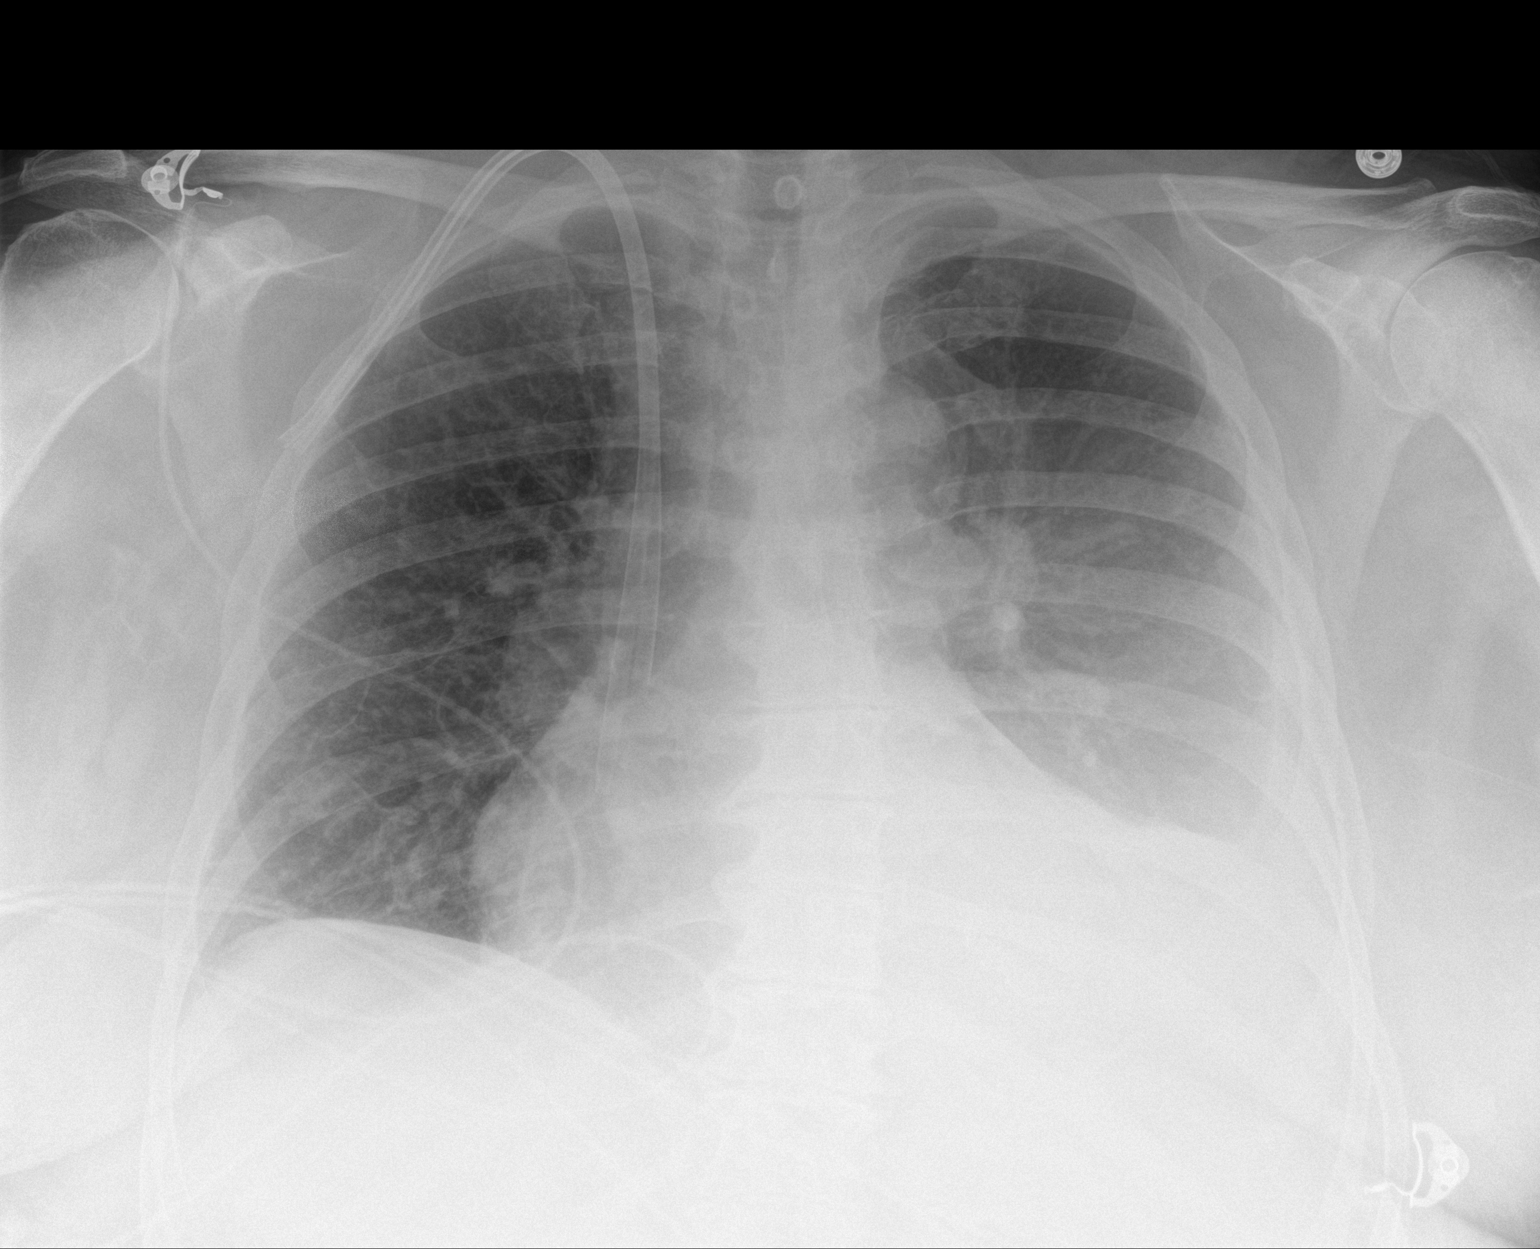

[1 of 1 positions shown; findings below may reference images not displayed]

FINDINGS: Left pleural effusion is decreased after thoracentesis. No
pneumothorax. The right lung is clear. Heart size is upper normal.
Dialysis catheter is noted.
IMPRESSION: Decreased left pleural effusion after thoracentesis. No
pneumothorax.

## 2016-04-08 IMAGING — US US THORACENTESIS ASP PLEURAL SPACE W/IMG GUIDE
1 series · 5 of 5 positions shown · non-contrast
Comparison: None.

MEDICATIONS:
10 cc 1% lidocaine

COMPLICATIONS:
None immediate

INDICATION: Symptomatic L sided pleural effusion

EXAM:
US THORACENTESIS ASP PLEURAL SPACE W/IMG GUIDE
TECHNIQUE: Informed written consent was obtained from the patient after a
discussion of the risks, benefits and alternatives to treatment. A
timeout was performed prior to the initiation of the procedure.

[Series 1: us thoracentesis asp pleural space w/img guide · 0.27mm/px · 5 of 5 slices shown]
[im 1/5]
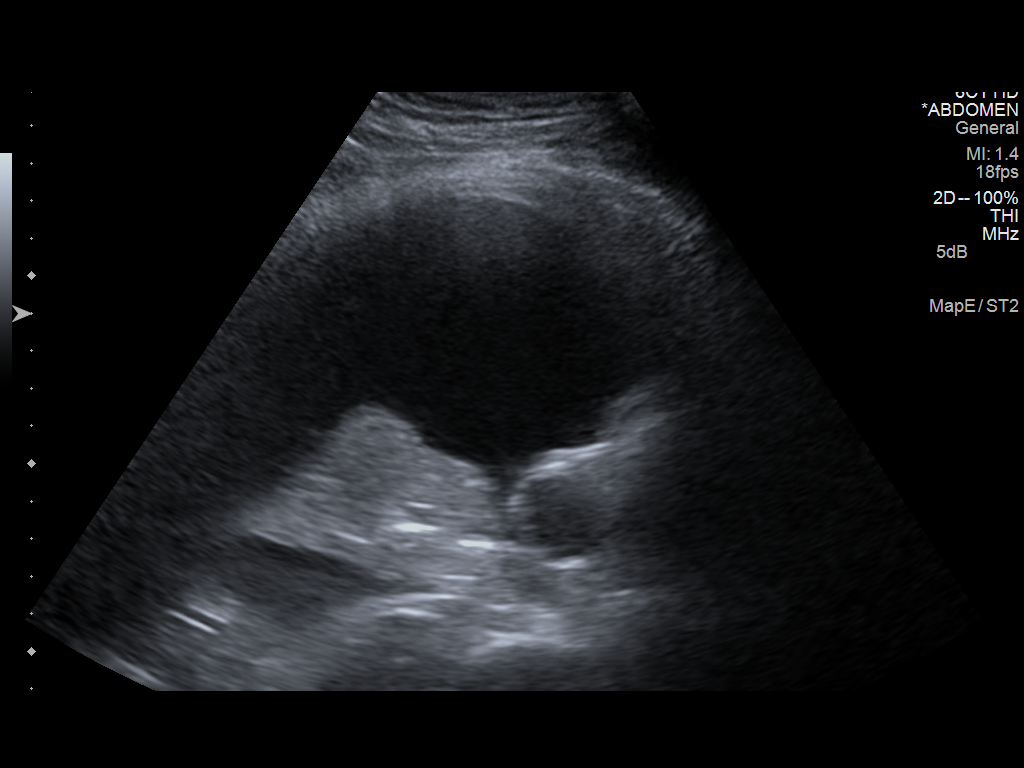
[im 2/5]
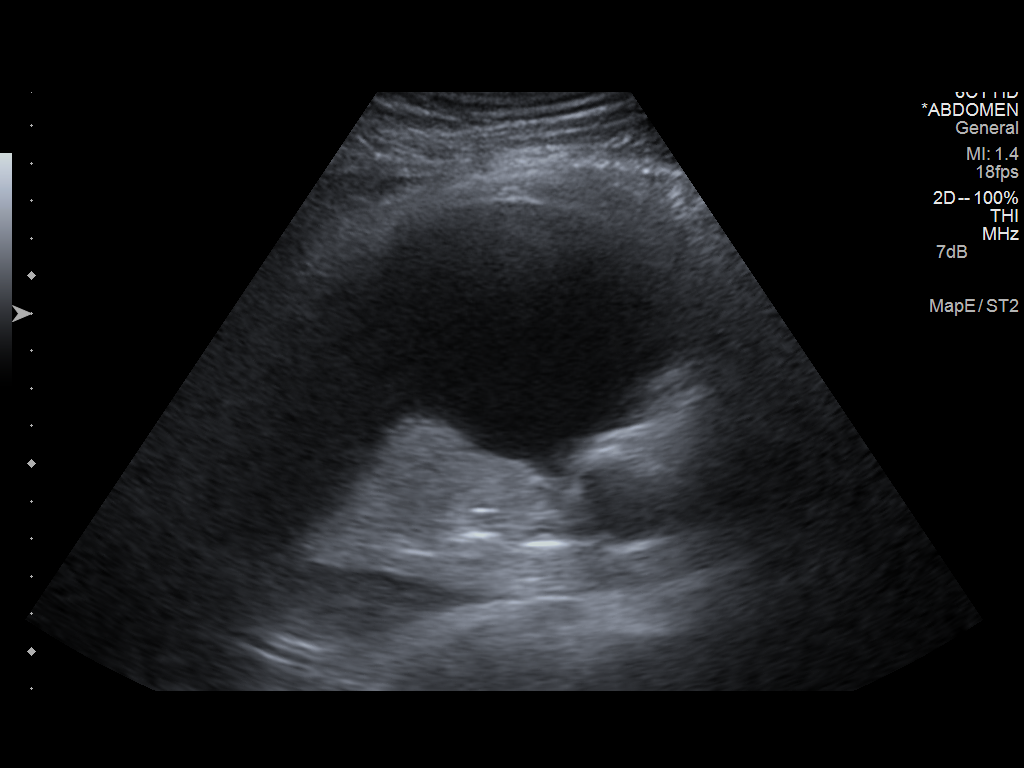
[im 3/5]
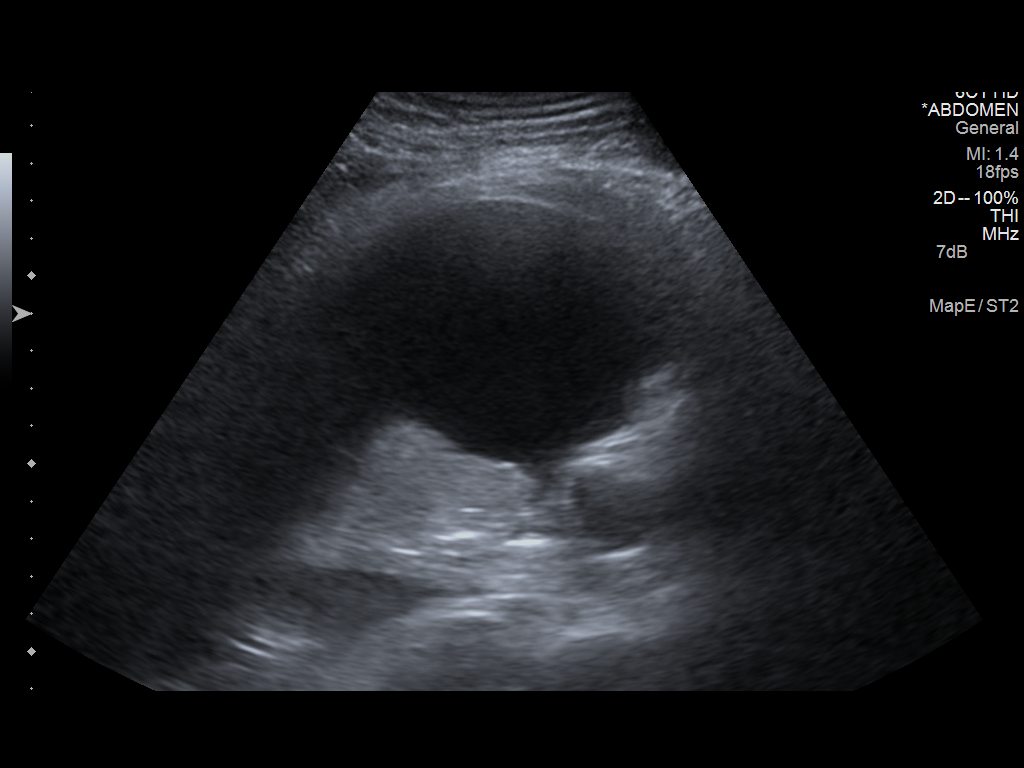
[im 4/5]
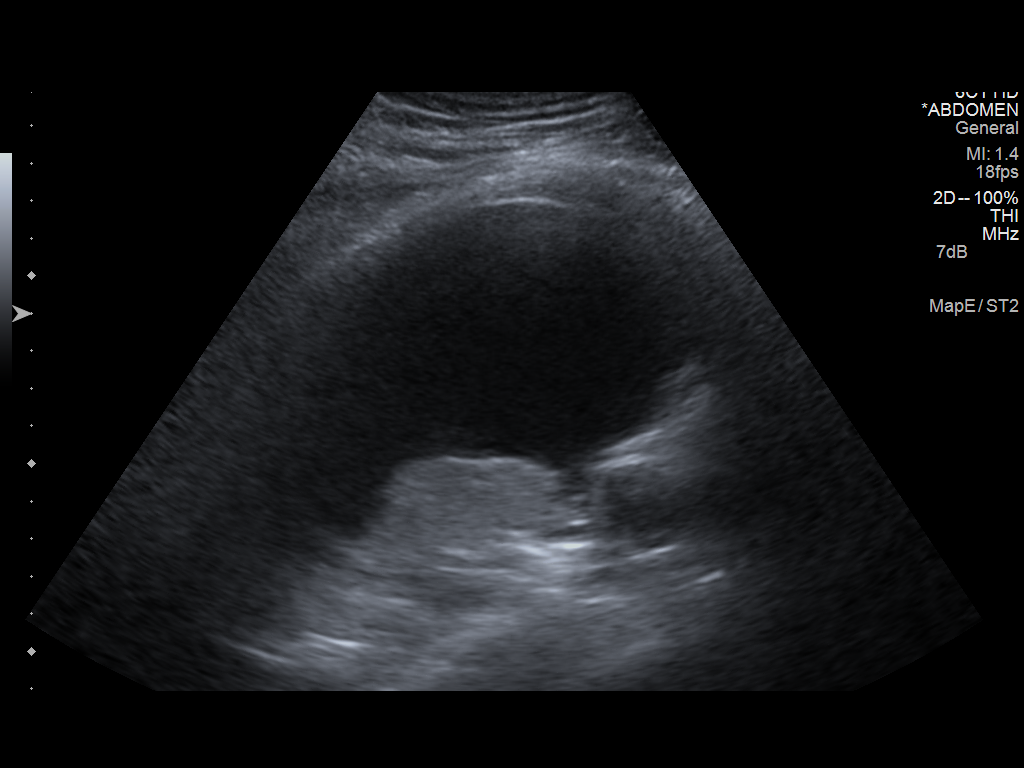
[im 5/5]
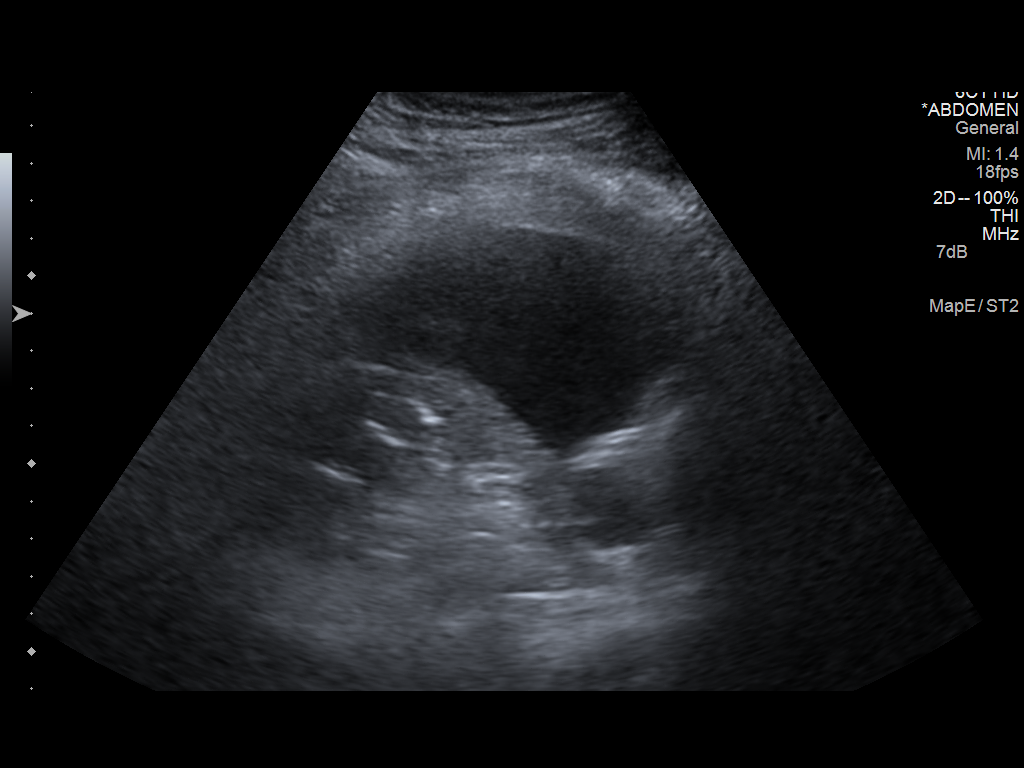

[5 of 5 positions shown; findings below may reference images not displayed]

Initial ultrasound scanning demonstrates a left pleural effusion.
The lower chest was prepped and draped in the usual sterile fashion.
1% lidocaine was used for local anesthesia.

Under direct ultrasound guidance, a 19 gauge, 7-cm, Yueh catheter
was introduced. An ultrasound image was saved for documentation
purposes. The thoracentesis was performed. The catheter was removed
and a dressing was applied. The patient tolerated the procedure well
without immediate post procedural complication. The patient was
escorted to have an upright chest radiograph.
FINDINGS: A total of approximately 600 cc of yellow fluid was removed.
Requested samples were sent to the laboratory.
IMPRESSION: Successful ultrasound-guided L sided thoracentesis yielding 600 cc
of pleural fluid.

Read by:  Mekdim Belcher

## 2016-06-11 IMAGING — CT CT ABD-PELV W/O CM
2 of 4 series · 15 of 46 positions shown, 17 images · non-contrast
Comparison: PET-CT 10/16/2014.  Abdominal CT 10/15/2014.

CLINICAL DATA: Abscess with drainage from incision site following
JP drain removal last week. Colon cancer. Subsequent encounter.

EXAM:
CT ABDOMEN AND PELVIS WITHOUT CONTRAST
TECHNIQUE: Multidetector CT imaging of the abdomen and pelvis was performed
following the standard protocol without IV contrast.

[Series 2: routine abd pel wo · axial · 0.91mm/px · z∈[-344,+72]mm · 12 of 95 slices shown, 14 images]
[im 8/95  soft-tissue]
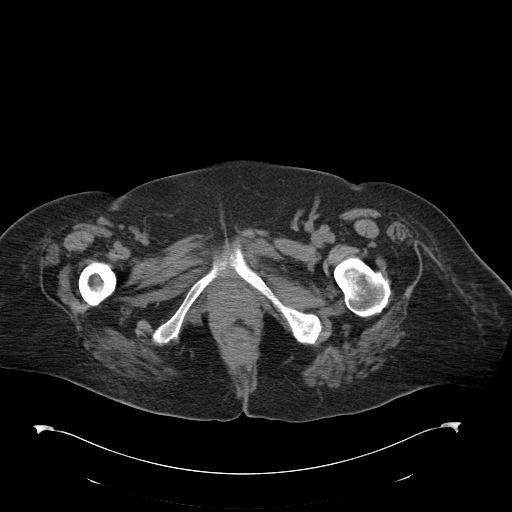
[im 8/95  bone]
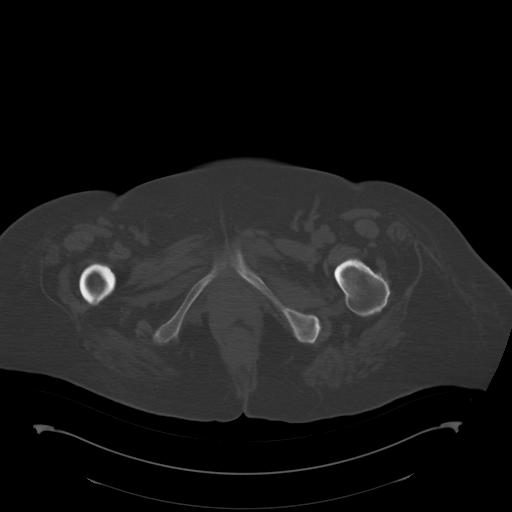
[im 16/95  soft-tissue]
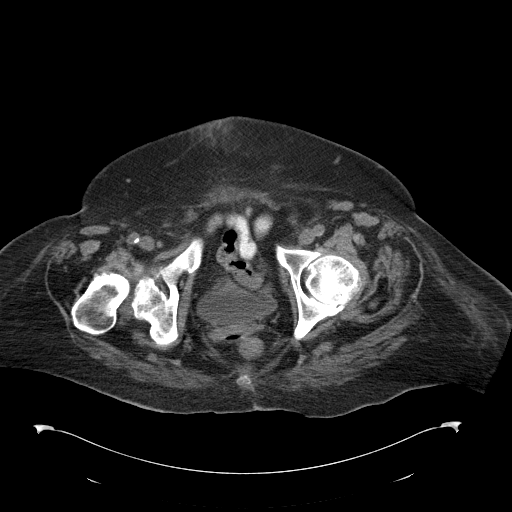
[im 23/95  soft-tissue]
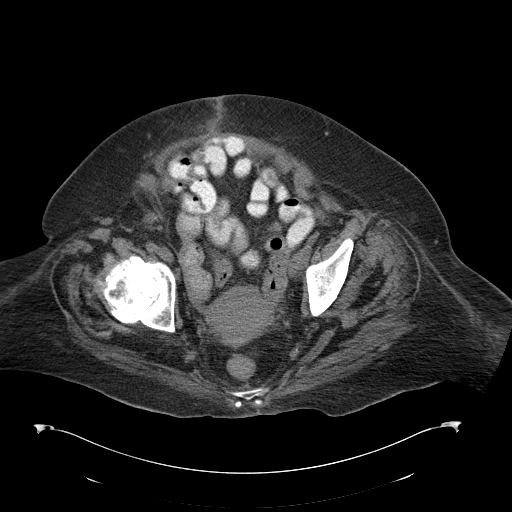
[im 31/95  soft-tissue]
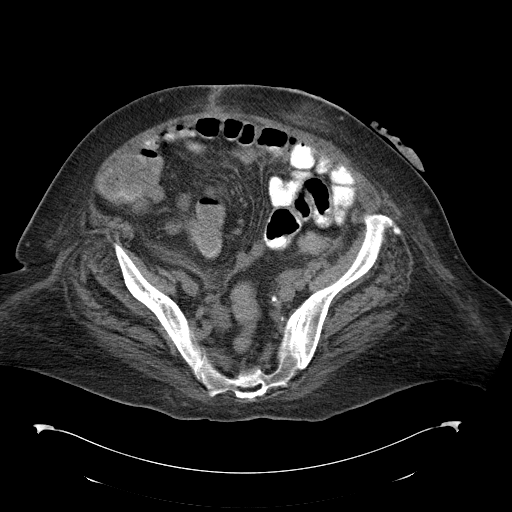
[im 38/95  soft-tissue]
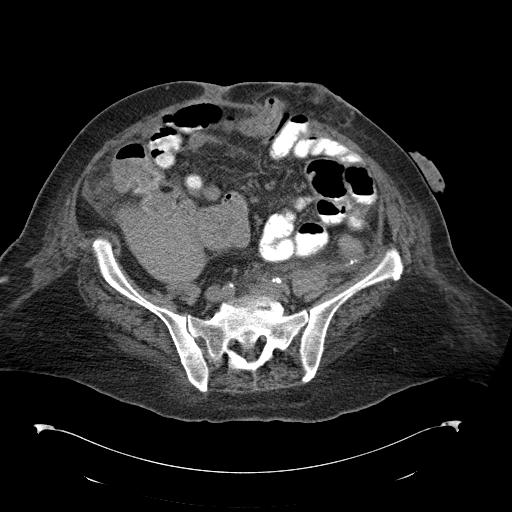
[im 46/95  soft-tissue]
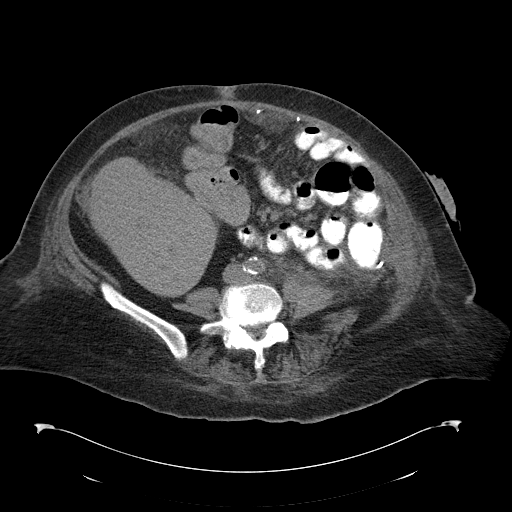
[im 53/95  soft-tissue]
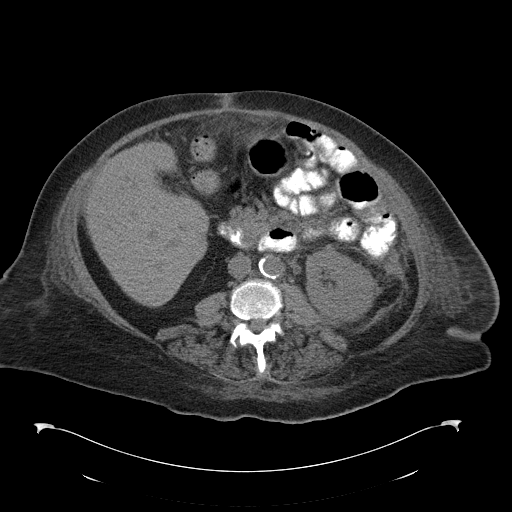
[im 61/95  soft-tissue]
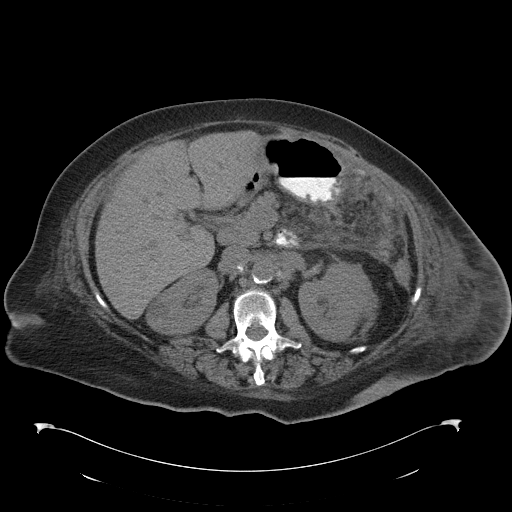
[im 68/95  soft-tissue]
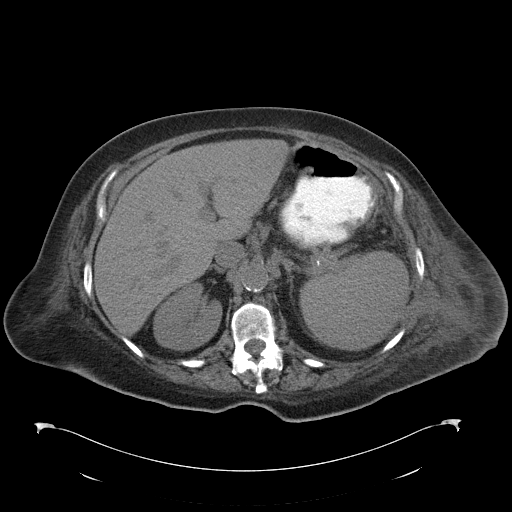
[im 68/95  bone]
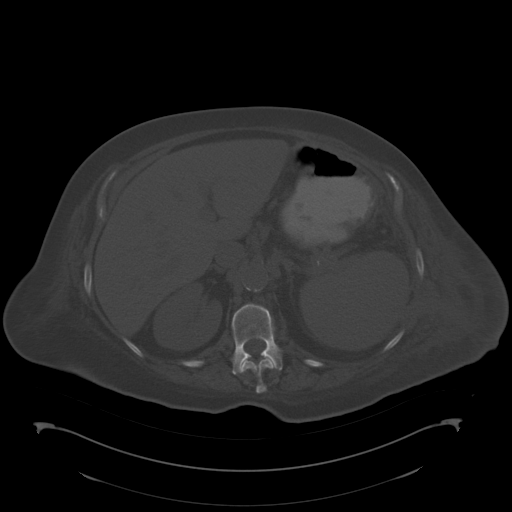
[im 76/95  soft-tissue]
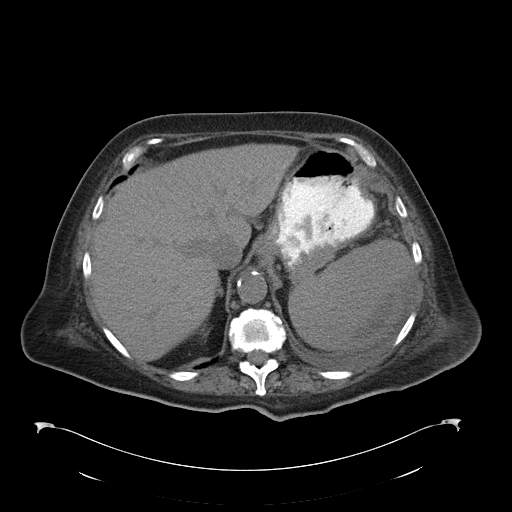
[im 83/95  soft-tissue]
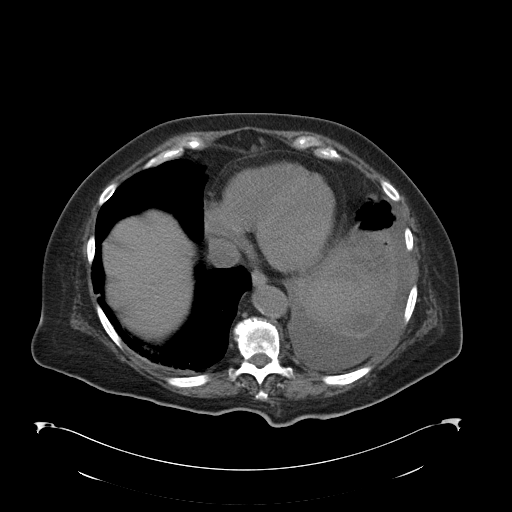
[im 91/95  soft-tissue]
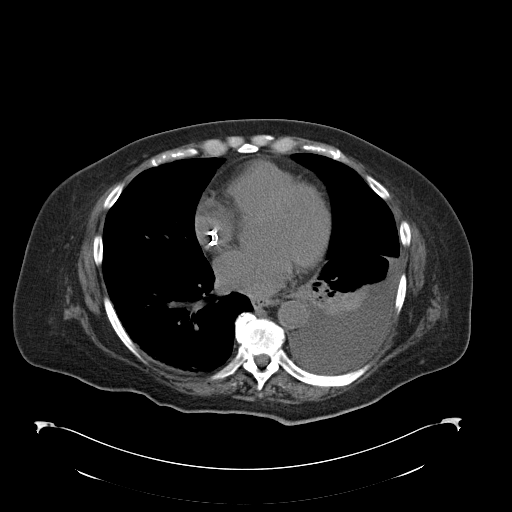

[Series 5: cor routine abd pel wo · coronal · 0.77mm/px · 3 of 155 slices shown]
[im 52/155  soft-tissue]
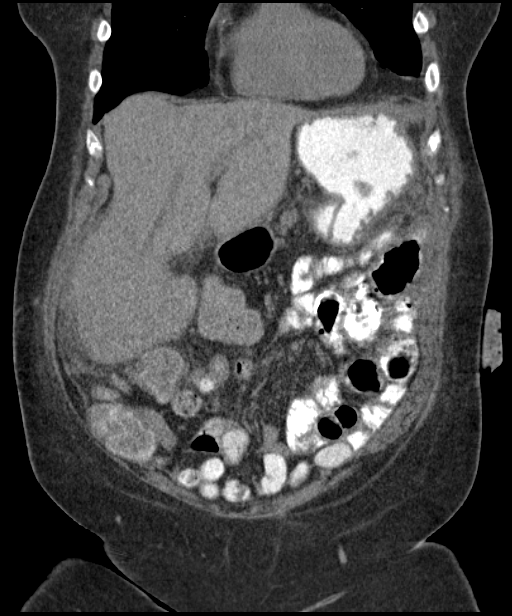
[im 69/155  soft-tissue]
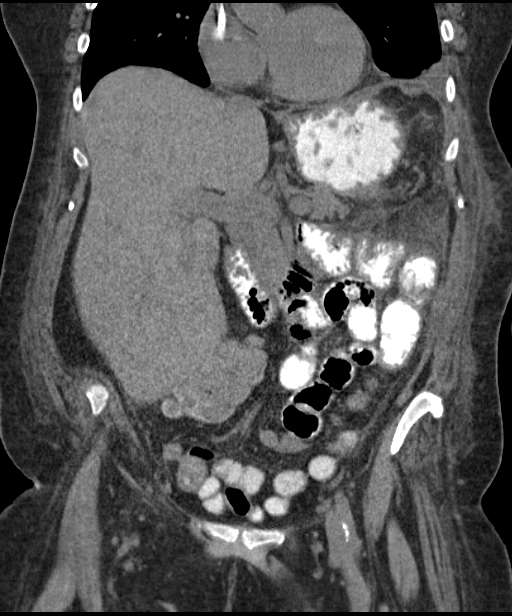
[im 86/155  soft-tissue]
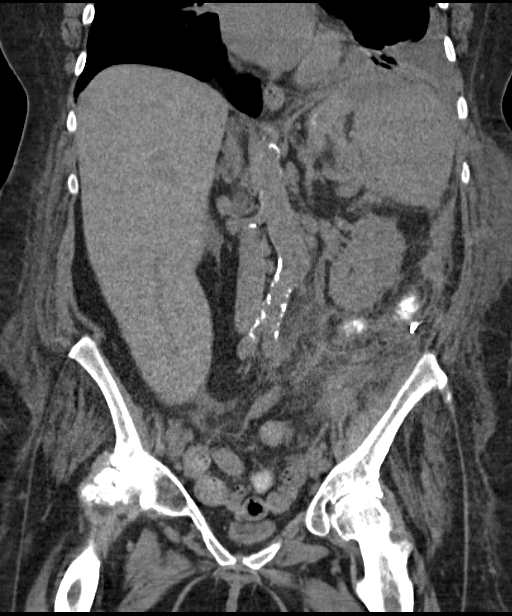

[15 of 46 positions shown; findings below may reference images not displayed]

FINDINGS: Lower chest: Stable moderate size dependent left pleural effusion
with associated left lower lobe atelectasis. There is no significant
pleural fluid on the right. Emphysematous changes are present in
both lung bases. Dialysis catheter tips in the upper right atrium.

Hepatobiliary: As evaluated in the noncontrast state, stable without
focal abnormality. No evidence of biliary dilatation status post
cholecystectomy.

Pancreas: Unremarkable. No pancreatic ductal dilatation or
surrounding inflammatory changes.

Spleen: Grossly stable posterior perisplenic fluid collection,
measuring 6.0 x 2.4 cm on image 22. The spleen is normal in size
without definite intraparenchymal abnormality.

Adrenals/Urinary Tract: Stable nonspecific 2.4 cm left adrenal
nodule. The right adrenal gland appears normal.The kidneys appear
stable with a cyst in the right interpolar region measuring 2.1 cm.
There is asymmetric perinephric structure stranding on the left. No
urinary tract calculus or hydronephrosis identified. The bladder is
nearly empty without apparent abnormality.

Stomach/Bowel: Distal transverse colostomy has a stable appearance.
There are postsurgical changes within the anterior abdominal wall.
No soft tissue abscess identified. There is no evidence of bowel
obstruction or focal bowel wall thickening. There is mild mesenteric
edema without focal extraluminal fluid collection.

Vascular/Lymphatic: Small retroperitoneal lymph nodes are stable.
There is stable scattered atherosclerosis of the aorta, its branches
and the iliac arteries.

Reproductive: Unremarkable.

Other: None.

Musculoskeletal: No acute or significant osseous findings. Stable
mild degenerative changes throughout the lumbar spine. There are
advanced asymmetric right hip degenerative changes.
IMPRESSION: 1. No evidence of abdominal wall abscess. The postsurgical changes
in the anterior abdominal wall and transverse colostomy appear
unchanged.
2. Stable perisplenic fluid collection and asymmetric soft tissue
stranding throughout the left abdomen. No new or enlarging
intra-abdominal fluid collections demonstrated.
3. Stable left pleural effusion and left basilar atelectasis.
4. No evidence of metastatic disease on noncontrast imaging.

## 2017-07-08 DEATH — deceased
